# Patient Record
Sex: Female | Born: 1960
Health system: Southern US, Community
[De-identification: ages and names within clinical notes are randomized; demographics above are authoritative.]

## PROBLEM LIST (undated history)

## (undated) DIAGNOSIS — E079 Disorder of thyroid, unspecified: Secondary | ICD-10-CM

## (undated) DIAGNOSIS — G6181 Chronic inflammatory demyelinating polyneuritis: Secondary | ICD-10-CM

## (undated) HISTORY — PX: APPENDECTOMY: SHX54

## (undated) HISTORY — PX: CHOLECYSTECTOMY: SHX55

## (undated) HISTORY — PX: ABDOMINAL HYSTERECTOMY: SHX81

## (undated) HISTORY — PX: SPINAL FUSION: SHX223

---

## 2014-08-08 ENCOUNTER — Ambulatory Visit: Admit: 2014-08-08 | Payer: PRIVATE HEALTH INSURANCE | Attending: Neurology

## 2014-08-08 DIAGNOSIS — R29898 Other symptoms and signs involving the musculoskeletal system: Secondary | ICD-10-CM

## 2014-08-08 NOTE — Unmapped (Signed)
Neurology Attending  As the physician of record, I was physically present for the key portions of the history and physical exam. Pt seen and examined with resident. I participated in medical decision making. I have read the above note by Dr. Venetia Night and I agree with the information as documented.    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 Moved here from [place] and looking to establish care here with [place].    In 2006, she had an episode of syncope July.  When she woke up in the hospital she had one sided weakness.  Over 24 hours she had bilateral weakness from toes to neck.  Never ventilated.  She was diagnosed with GBS. Treated with IVIG for 3 days. Unsure of what the work up in [place] was at that time, maybe LP.  Four months in hospital and rehab. Walking by Christmas.  Spring of 2007, had another fainting episode. No hospital.  Then things got worse.  More pain with this second episode. Intense burning sensation in her bones all over. EMG this time.    Outpatient neurology.  Started IVIG again and then did this weekly for years.  Treatment stopped 2 years ago.  Unsure if she responded well.  Sounds like Humira was tried. It was stopped.    Functional:  She cannot walk. She can transfer. She told me she can stand at the sink.  Does her ADLs though with eating, bathing, dressing.  No limitations arms.  No bladder incontinence.  Pain issues in feet.    Exam:  Neuro:  MS: Alert and oriented.  Fluent.  Repeats, names.  Months forwards and backwards. Registers and then recalls 3/3 at 2 minutes.  CN: Visual fields full x4.  Pupils 5 to 3 bilaterally brisk symmetric.  EOM full.  No ptosis.  Face symmetric movement.  Facial sensation symmetric to pinprick.  Tongue midline, no tongue fasciculations.  Oropharynx symmetric, no dysarthria. Phonation good.  No nystagmus.  Fundus: deferred  M: Normal bulk, normal tone in upper extremities and lower extremities.   Strength R/L MRC is Deltoid 5/5 VFG, Triceps 5/5 VFG, Biceps 5-/5- VFG, Wrist extension 5/5, Wrist Flexion 5/5, FF 5/5, IO 4/4 VFG.  In lowers, Iliopsoas 4/4 to 4+/4+ VFG, Hip abduct 4/4 VFG, Hip adduct 4/4 VFG, Hamstrings 4+/4+ VFG with co-contraction Quadriceps, Quadriceps 4+/4+ VFG with co-contraction Hamstrings, Dorsiflex 3/3 giveway.  S: intact light touch in 4 extremities.  Intact vibration in toes at 3,4.  Position sense toes intact.    R: R/L  Toes down/down.  Ankles 2/2, knees 2/2, biceps 2/2, triceps 2/2.    C: FTN intact  Gait able to stand up, very unusual gait with right foot inverting and plantarflexing when she stands and tries to walk on it    General: Pleasant WF, NAD  Neck supple, no bruits  CTA  RRR  Extrem no c/c/e, purplish discoloration bilateral feet    No records to review    A/P 53 yo WF with inability to walk.  Confusing clinical picture. She sounds like she had GBS back in 2006, but even that is unusual as she says it started on one side of her body and then went to the other, rather than the typical progression from the legs to the arms.  Her exam was not consistent with CIDP.  She had good reflexes throughout, even at ankles. She says she had no reflexes until recently.  No signs of myelopathy by exam.  Her gait pattern looked almost more like a dystonic or UMN posturing of the right foot, but I saw no other UMN signs.    Diagnostic:  Repeat EMG.  Cancel if clear demyelinating disease.  Getting records from outside hospital for review. Need to know what her immunsoppression  Spine imaging?  Blood work?    No therapeutic intervention until we have a better understanding of this disease.  If this is CIDP, then we can think of pheresis, steroids, and other immunosuppression.    Will defer pain management to PCP or pain.  I am not able to write the narcotic prescriptions for her.    RTC after diagnostic testing.

## 2014-08-08 NOTE — Unmapped (Signed)
Pt seen and examined with resident.  Agreed with the findings on exam and the plan as detailed below.

## 2014-08-08 NOTE — Unmapped (Signed)
Subjective:      Patient ID: Molly Hays is a 53 y.o. female with a history of migraines, GBS, hypothyroid and lower back surgery presenting with lower extremity weakness.     HPI     Patient is present with her husband who corroborated. In 2006 diagnosed with GBS in [place], [place] [facility], [facility]). Patient was initially ill (viral), then had fainted in the parking lot and woke-up (in hospital) with stroke-like symptoms (weak on on one side, arm and leg). As the day progressed, the paralysis moved up to her neck, could not move bilateral upper or lower extremities. No bulbar involvement. Neurology at that hospital treated with 3 days IVIG. She was never intubated. She improved slowly after IVIG. Hospitalized in July and discharged to rehab in October. Pain was present, in hands and feet, pins and needles (per husband, patient herself does not remember). Fainting was attributed to low blood pressure, per patient.     By Christmas 2006 was walking un-aided.     May 2007 patient had another fainting episode. Not hospitalized. Awoke without deficits but trended downhill after that episode. She states that the weakness was primarily in her legs, don't want to go. Pain she describes as bone pain, states that it feels that her legs are, on fire like a blow torch.     Fall 2007 saw saw neurologist Dr. Oliver Barre who did an EMG and diagnosed her with CIDP. There was also a lumbar puncture done at this time, something about proteins.     Since Sept 2007 started on IVIG, at first was a couple days/week, then less frequently. Patient was going every 2 weeks, then monthly. Last IVIG was at least 2 years ago.     Was tried on Cellcept, Lyrica, Octogam?, Cymbalta, Humira. Others as well but cannot remember all the names.     Currently patient is not on anything for CIDP. Last treatment was with Humira, however discontinued 1 year ago due to supply/insurance.     Patient states that she  responded well to IVIG. She does not think Humira helped but she didn't take it regularly.     Currently the patient is in a wheelchair. She is looking for, a miracle. Currently patient is the weakest in her legs, she gets around the house in a wheelchair. She is able to transfer on her own. Eats on own. Bathes on own. Dresses on own. Pain is 10/10 at times, she says it was, everywhere, from her toes to her neck. Sitting exacerbates her pain. Lying on side brings relief. She does not think that any of her medication helps. She is on very high doses of opioids at this time.        Histories:     Medical history: upset stomach chronically (has seen GI for many years, gastroparesis?), hypothyroid (takes levothyroxine), chronic transaminitis, complex migraine with left sided paralysis.     Surgical history: cholecystectomy, appendectomy, lower spinal fusion, c-section x3. No surgery since 1996.     Family history: DM in brother. No autoimmune, no weak or wheelchair-bound family members.     Social history: Smoker, 1 pack/day. No EtOH. No other drugs.     Review of Systems     Constitutional: Negative for weight loss, weight gain and fatigue.   HENT: Negative for trouble swallowing and voice change.   Eyes: Negative for double vision, visual disturbance  Respiratory: Occasional SOB, pain related  Cardiovascular: Negative for chest pain  and palpitations.   Gastrointestinal: Positive for constipation, positive for upset stomach.   Genitourinary: Negative for frequency  Musculoskeletal: Positive for pain in arms, legs, back, feet   Neurological: Positive for headaches    Psychiatric/Behavioral: Negative for sleep disturbance.       Allergies:   Review of patient's allergies indicates no known allergies.    Medications:     Outpatient Encounter Prescriptions as of 08/08/2014   Medication Sig Dispense Refill   ??? almotriptan (AXERT) 12.5 MG tablet Take 12.5 mg by mouth as needed for Migraine. may repeat in 2 hours if needed        ??? cyclobenzaprine (FLEXERIL) 10 MG tablet Take 10 mg by mouth 2 times a day.       ??? frovatriptan (FROVA) 2.5 MG tablet Take 2.5 mg by mouth as needed for Migraine. If recurs, may repeat after 2 hours. Max of 3 tabs in 24 hours.       ??? levothyroxine (SYNTHROID, LEVOTHROID) 50 MCG tablet Take 50 mcg by mouth daily.       ??? oxyCODONE (OXYCONTIN) 30 MG Tb12 Take 30 mg by mouth 2 times a day.       ??? oxyCODONE (OXYCONTIN) 80 MG 12 hr tablet Take 80 mg by mouth 3 times a day as needed for Pain.       ??? oxyCODONE (ROXICODONE) 30 MG immediate release tablet Take 30 mg by mouth every 4 hours as needed for Pain.       ??? promethazine (PHENERGAN) 25 MG tablet Take 25 mg by mouth as needed for Nausea.       ??? sulfamethoxazole-trimethoprim (BACTRIM DS) 800-160 mg per tablet Take 1 tablet by mouth 2 times a day.       ??? zolpidem (AMBIEN) 10 mg tablet Take 10 mg by mouth at bedtime as needed for Sleep.         No facility-administered encounter medications on file as of 08/08/2014.        Objective:       Blood pressure 112/60, pulse 74, height 5' 8 (1.727 m), weight 188 lb (85.276 kg).    Neurologic Exam     Mental Status   Oriented to person, place, and time.   Attention: normal. Concentration: normal.   Speech: speech is normal     Cranial Nerves      CN II   Right visual field deficit: none  Left visual field deficit: none      CN III, IV, VI   Pupils are equal, round, and reactive to light.  Extraocular motions are normal.   Right pupil: Size: 3 mm.   Left pupil: Size: 3 mm.   CN III: no CN III palsy  CN VI: no CN VI palsy  Nystagmus: none   Diplopia: none     CN V   Facial sensation intact.      CN VII   Facial expression full, symmetric.      CN VIII   CN VIII normal.      CN IX, X   CN IX normal.      CN XI   CN XI normal.   Right sternocleidomastoid strength: normal  Left sternocleidomastoid strength: normal     CN XII   CN XII normal.   Tongue: not atrophic  Tongue deviation: none    Motor Exam   Muscle bulk:  decreased calf muscle bulk, bilateral   Overall muscle tone: normal  Strength   Left strength: Left lower extremity marginally better than right  Right deltoid: 5/5  Left deltoid: 5/5  Right biceps: 5/5  Left biceps: 5/5  Right triceps: 5/5  Left triceps: 5/5  Right wrist flexion: 5/5  Left wrist flexion: 5/5  Right wrist extension: 5/5  Left wrist extension: 5/5  Right interossei: 5/5  Left interossei: 5/5  Right quadriceps: 4/5  Left quadriceps: 4/5  Right hamstring: 4/5  Left hamstring: 4/5  Right anterior tibial: 3/5  Left anterior tibial: 3/5  Right posterior tibial: 3/5  Left posterior tibial: 3/5  Right gastroc: 3/5  Left gastroc: 3/5    Sensory Exam   Right arm light touch: normal  Left arm light touch: normal  Right leg light touch: decreased from knee  Left leg light touch: decreased from knee  Right arm proprioception: normal  Left arm proprioception: normal  Right leg proprioception: normal  Left leg proprioception: normal  Right arm pinprick: normal  Left arm pinprick: normal  Right leg pinprick: decreased from knee  Left leg pinprick: decreased from knee       Unable to do 2 point discriminationfrom knee down bilaterally     Gait, Coordination, and Reflexes      Tremor   Resting tremor: absent     Reflexes   Right brachioradialis: 2+  Left brachioradialis: 2+  Right biceps: 2+  Left biceps: 2+  Right triceps: 2+  Left triceps: 2+  Right patellar: 2+  Left patellar: 2+  Right achilles: 1+  Left achilles: 1+  Right plantar: normal  Left plantar: normal      Physical Exam   Constitutional: She is oriented to person, place, and time.   Eyes: EOM are normal. Pupils are equal, round, and reactive to light.   Neurological: She is oriented to person, place, and time.   Reflex Scores:       Tricep reflexes are 2+ on the right side and 2+ on the left side.       Bicep reflexes are 2+ on the right side and 2+ on the left side.       Brachioradialis reflexes are 2+ on the right side and 2+ on the left side.        Patellar reflexes are 2+ on the right side and 2+ on the left side.       Achilles reflexes are 1+ on the right side and 1+ on the left side.  Psychiatric: Her speech is normal.       Prior Diagnostic Testing:     EMG (2007) currently being sent over from OSH    LP results - currently being sent over from OSH         Assessment & Plan:     53 yo with a history of migraines, lower back fusion, hypothyroid who presents with chronic, relapsing condition of lower muscular weakness and sensory loss which has been stable for this past year year. Previously diagnosed with CIPD, AIDP and relapsing CIPD?Marland Kitchen Treated with various modalities including IVIG with no clear response. She has bilateral 2+ reflexes including ankle which would not be consistent with classic CIDP. Had prior lower back surgery (unclear on details) which could be contributing? Additionally, there are functional components to her motor exam. There is a paucity of medical records at this time.     - Repeat EMG  - Get records from OSH. They are en route per patient.   - Referral to pain clinic for  pain management  - Further treatment pending record review and new studies

## 2014-09-01 ENCOUNTER — Encounter: Attending: Neurology

## 2014-09-01 NOTE — Unmapped (Signed)
Patient called to check & see if medical records that she had requested (second time) have been received yet? Also need a prescription refilled. Please return call.

## 2014-09-02 MED ORDER — ibuprofen (ADVIL,MOTRIN) 800 MG tablet
800 | ORAL_TABLET | Freq: Three times a day (TID) | ORAL | Status: AC | PRN
Start: 2014-09-02 — End: ?

## 2014-09-02 NOTE — Unmapped (Signed)
Routing to Dr. Lysle Morales to address the [facility]

## 2014-09-02 NOTE — Unmapped (Signed)
Husband checking on records, were faxed to [phone], check bin and records were there-given to Dr Lysle Morales.  Also requests refill of 800 mg Ibuprofen for pain control, previously prescribed elsewhere.  To go to [facility].  Inquired re recommended pain clinic also.  Please advise husband.

## 2014-09-02 NOTE — Unmapped (Signed)
Notes reviewed from 2011 to April 2015.  No documented change in her strength exam from Oct 2011 to April 2015, despite being off and on treated with IVIG and Rituxan.  No EMG in any of those records from Dr. Smith Rolande Moe.    1. Please make sure she has an EMG scheduled.  Ordered on 9/11 but I do not see it scheduled in our system and I need that before her follow up visit with me.  Can use onf the 3:30 spots if we need to.

## 2014-09-02 NOTE — Unmapped (Signed)
Left a message on the pts vm that I have not received records for her. Also that if she needs rx to call me and let me know which one I will see if Dr. Lysle Morales will ok the refill.

## 2014-09-02 NOTE — Unmapped (Signed)
Okay with ibuprofen.  Written for 800 mg to be used intermittently and should be taken with food.  They would need to lok into a pain clinic. [facility] has one, but they might prefer someone closer to them in [place].

## 2014-09-03 NOTE — Unmapped (Signed)
Left a detailed message on the pts vm with the message below from Dr. Lysle Morales.   Rx for ibuprofen written and sent to the pharmacy, pain management through a pain clinic and records were received.

## 2014-09-10 ENCOUNTER — Ambulatory Visit: Admit: 2014-09-10 | Payer: PRIVATE HEALTH INSURANCE | Attending: Neurology

## 2014-09-10 DIAGNOSIS — Z8669 Personal history of other diseases of the nervous system and sense organs: Secondary | ICD-10-CM

## 2014-09-10 NOTE — Unmapped (Addendum)
Patient: Molly Hays Refer Physician: Dr. Caren Griffins  Date of Birth: 11/03/1961 Date of Exam: 09/10/2014  Age:               53 Years 3 Months           Test Physician:  Dr. Caren Griffins        Indication for Referral:  The patient was evaluated for right upper extremity mononeuropathies, radiculopathies, and peripheral neuropathy.      Summary:  The nerve conduction studies of the right leg and right arm were normal.  The needle exam of the right upper and right lower extremities was normal.  A new disposable needle electrode was used and discarded.      Interpretation:  The EMG of the right upper and right lower extremities was normal.  There was no evidence of large fiber sensorimotor peripheral neuropathy, inflammatory myopathy, nor cervical or lumbar radiculopathy on today???s exam.        ------------------------------------------------  Rickard Rhymes, M.D.          Sensory NCS      Nerve / Sites Take Off Peak  Amp.1-2 Dist. Vel.    ms ms ??V cm m/s   R MEDIAN - Index Finger      WRIST              2.34 3.23 42.7 13       ELBOW            6.98 8.91 24.5 27.2 58.7   R ULNAR - Digit 5th      Wrist 2.34 3.02 21.4 11       B.Elbow 8.23 9.58 20.6 36.3 61.7   R SURAL - Lat Malleolus      Distal Leg 6.93 8.96 41.6 14 20.2       Motor NCS      Nerve / Sites Latency Ampl. Distance Velocity    ms mV cm m/s   R MEDIAN - thenar      Wrist 3.96 13.6 7       Elbow 8.75 12.7 24.7 51.5   R ULNAR - Hypothenar      Wrist 2.97 10.4 6.5       A.Elbow 8.75 9.8 33.8 58.5   R COMM PERONEAL - Fibular-EDB      Ankle 4.74 5.6        Knee 14.01 4.6 44.2 47.7   R TIBIAL (KNEE) - AH      Ankle 3.85 12.2        Popliteal Fossa 13.13 11.5 41.2 44.4       F  Wave      Nerve Fmin    ms   R COMM PERONEAL - FIBULAR 55.21   R ULNAR 31.00   R TIBIAL (KNEE) 54.84   R MEDIAN 27.60       NERVE CONDUCTION STUDY NORMAL VALUES (skin temp. 34??? C)  MOTOR AMP LAT COND. VEL. F-WAVE    SENSORY AMP LAT COND. VEL.  (mV) (msec)   (m/sec) (msec)   (mV) (msec)    (m/sec)  Median (>3.9) (<4.5)   (>48)  (<32)       Radial (>20) (<2.9)   (>   )      Ulnar (>6.5) (<3.4)   (>53)  (<33)       Median (>24.9)(<3.6)   (>56)   Peroneal(>1.9)(<7.1)   (>40)  (< 59)       Ulnar (>9.6) (<3.0)   (>  54)  Tibial (> 3.9) (<5.9)   (>39)  (<57 )      Sural (>5.9) (<4.1)   (>    )        Needle Exam  EMG Summary Table     Spontaneous MUAP Recruitment    IA Fib PSW Fasc Other Amp Dur. PPP Pattern   R. TIB ANTERIOR N None None None None N N N N   R. GASTROCN (MED) N None None None None N N N N   R. VAST LATERALIS N None None None None N N N N   R. T FASCIA LATA N None None None None N N N N   R. BIC FEM (L HEAD) N None None None None N N N N   R. LUMB PSP (L) -- -- -- -- -- -- -- -- Deferred due to past lumbar surgery   R. DELTOID N None None None None N N N N   R. FIRST D INTEROSS N None None None None N N N N       EMG done with Dr. Odis Luster, Neuromuscular Fellow.  I was present for the entire procedure and participated in the key elements of the EMG.

## 2014-09-10 NOTE — Unmapped (Signed)
Patient: Molly Hays Refer Physician: Dr. Caren Griffins  Date of Birth: 12-27-1960 Date of Exam: 09/10/2014  Age:               53 Years 3 Months           Test Physician:  Dr. Caren Griffins        Indication for Referral:  The patient was evaluated for right upper extremity mononeuropathies, radiculopathies, and peripheral neuropathy.      Summary:  The nerve conduction studies of the right leg and right arm were normal.  The needle exam of the right upper and right lower extremities was normal.  A new disposable needle electrode was used and discarded.      Interpretation:  The EMG of the right upper and right lower extremities was normal.  There was no evidence of large fiber sensorimotor peripheral neuropathy, inflammatory myopathy, nor cervical or lumbar radiculopathy on today???s exam.        ------------------------------------------------  Rickard Rhymes, M.D.          Sensory NCS      Nerve / Sites Take Off Peak  Amp.1-2 Dist. Vel.    ms ms ??V cm m/s   R MEDIAN - Index Finger      WRIST              2.34 3.23 42.7 13       ELBOW            6.98 8.91 24.5 27.2 58.7   R ULNAR - Digit 5th      Wrist 2.34 3.02 21.4 11       B.Elbow 8.23 9.58 20.6 36.3 61.7   R SURAL - Lat Malleolus      Distal Leg 6.93 8.96 41.6 14 20.2       Motor NCS      Nerve / Sites Latency Ampl. Distance Velocity    ms mV cm m/s   R MEDIAN - thenar      Wrist 3.96 13.6 7       Elbow 8.75 12.7 24.7 51.5   R ULNAR - Hypothenar      Wrist 2.97 10.4 6.5       A.Elbow 8.75 9.8 33.8 58.5   R COMM PERONEAL - Fibular-EDB      Ankle 4.74 5.6        Knee 14.01 4.6 44.2 47.7   R TIBIAL (KNEE) - AH      Ankle 3.85 12.2        Popliteal Fossa 13.13 11.5 41.2 44.4       F  Wave      Nerve Fmin    ms   R COMM PERONEAL - FIBULAR 55.21   R ULNAR 31.00   R TIBIAL (KNEE) 54.84   R MEDIAN 27.60         NERVE CONDUCTION STUDY NORMAL VALUES (skin temp. 34??? C)  MOTOR AMP LAT COND. VEL. F-WAVE    SENSORY AMP LAT COND. VEL.  (mV) (msec)   (m/sec) (msec)   (mV) (msec)    (m/sec)  Median (>3.9) (<4.5)   (>48)  (<32)       Radial (>20) (<2.9)   (>   )      Ulnar (>6.5) (<3.4)   (>53)  (<33)       Median (>24.9)(<3.6)   (>56)   Peroneal(>1.9)(<7.1)   (>40)  (< 59)       Ulnar (>9.6) (<3.0)   (>  54)  Tibial (> 3.9) (<5.9)   (>39)  (<57 )      Sural (>5.9) (<4.1)   (>    )      Needle Exam  EMG Summary Table     Spontaneous MUAP Recruitment    IA Fib PSW Fasc Other Amp Dur. PPP Pattern   R. TIB ANTERIOR N None None None None N N N N   R. GASTROCN (MED) N None None None None N N N N   R. VAST LATERALIS N None None None None N N N N   R. T FASCIA LATA N None None None None N N N N   R. BIC FEM (L HEAD) N None None None None N N N N   R. LUMB PSP (L) -- -- -- -- -- -- -- -- Deferred due to past lumbar surgery   R. DELTOID N None None None None N N N N   R. FIRST D INTEROSS N None None None None N N N N       EMG done with Dr. Odis Luster, Neuromuscular Fellow.  I was present for the entire procedure and participated in the key elements of the EMG.

## 2014-10-10 ENCOUNTER — Ambulatory Visit: Admit: 2014-10-10 | Payer: PRIVATE HEALTH INSURANCE | Attending: Neurology

## 2014-10-10 DIAGNOSIS — Z8669 Personal history of other diseases of the nervous system and sense organs: Secondary | ICD-10-CM

## 2014-10-10 NOTE — Unmapped (Signed)
Subjective:      Patient ID: Molly Hays is a 53 y.o. female.    HPI   53 year old female with past history of complex migraines, depression and then a diagnosis of CIDP. Moved here from [place] and looking to establish care here with [place], and now here for follow up.  Had EMG for CIDP but the EMG was completely normal.  Notes reviewed from 2011 to April 2015.  No documented change in her strength exam from Oct 2011 to April 2015, despite being off and on treated with IVIG and Rituxan.  No EMG in any of those records from Dr. Smith Breckin [place].    Functional:  Continues to have pain and trouble walking.  She cannot walk. She can transfer. She told me she can stand at the sink.  Does her ADLs though with eating, bathing, dressing.  No limitations arms.  No bladder incontinence.  Pain issues in feet.           Histories:     She has a past medical history of H/O Guillain-Barre syndrome and Shingles (Nov 2015).    She has no past surgical history on file.    Her family history includes Kidney disease in her father.    She reports that she has been smoking.  She has never used smokeless tobacco. She reports that she does not drink alcohol or use illicit drugs.      Review of Systems   Constitutional: Positive for fatigue.   HENT: Negative for trouble swallowing and voice change.    Eyes: Negative for visual disturbance.   Respiratory: Negative for shortness of breath.    Cardiovascular: Negative for chest pain and palpitations.   Gastrointestinal: Negative for diarrhea and constipation.   Genitourinary: Negative for difficulty urinating.   Musculoskeletal: Positive for back pain and gait problem.       Allergies:   Review of patient's allergies indicates no known allergies.    Medications:     Outpatient Encounter Prescriptions as of 10/10/2014   Medication Sig Dispense Refill   ??? almotriptan (AXERT) 12.5 MG tablet Take 12.5 mg by mouth as needed for Migraine. may repeat in 2 hours if needed     ??? cyclobenzaprine (FLEXERIL)  10 MG tablet Take 10 mg by mouth 2 times a day.     ??? frovatriptan (FROVA) 2.5 MG tablet Take 2.5 mg by mouth as needed for Migraine. If recurs, may repeat after 2 hours. Max of 3 tabs in 24 hours.     ??? gabapentin (NEURONTIN) 300 MG capsule Take 300 mg by mouth 3 times a day.     ??? ibuprofen (ADVIL,MOTRIN) 800 MG tablet Take 1 tablet (800 mg total) by mouth every 8 hours as needed. 60 tablet 5   ??? levothyroxine (SYNTHROID, LEVOTHROID) 50 MCG tablet Take 50 mcg by mouth daily.     ??? oxyCODONE (OXYCONTIN) 30 MG Tb12 Take 30 mg by mouth 2 times a day.     ??? oxyCODONE (OXYCONTIN) 80 MG 12 hr tablet Take 80 mg by mouth 3 times a day as needed for Pain.     ??? oxyCODONE (ROXICODONE) 30 MG immediate release tablet Take 30 mg by mouth every 4 hours as needed for Pain.     ??? promethazine (PHENERGAN) 25 MG tablet Take 25 mg by mouth as needed for Nausea.     ??? zolpidem (AMBIEN) 10 mg tablet Take 10 mg by mouth at bedtime as needed for Sleep.     ???  sulfamethoxazole-trimethoprim (BACTRIM DS) 800-160 mg per tablet Take 1 tablet by mouth 2 times a day.       No facility-administered encounter medications on file as of 10/10/2014.        Objective:       Blood pressure 118/64, pulse 84, height 5' 8 (1.727 m), weight 192 lb (87.091 kg).    Neurologic Exam  MS: Alert and oriented.  Fluent.   CN: Visual fields deferred  Pupils brisk.  EOM full.  No ptosis.  Face symmetric movement.  Facial sensation deferred  Tongue midline.  No dysarthria. Phonation good.   Fundus: deferred  M: Strength R/L MRC is Deltoid 5/5 , Triceps 5/5 , Biceps 5/5, Wrist extension 5/5, Wrist Flexion 5/5, FF 5/5, IO 5/5.  In lowers, Iliopsoas 4/4+ VFG, Hip abduct 4/4 VFG, Hip adduct 4/4 VFG, Hamstrings 4+/4+ VFG with co-contraction Quadriceps, Quadriceps 4+/4+ VFG with co-contraction Hamstrings, Dorsiflex 3/3 giveway and foot inverts, FE 3/3 VFG  S: intact light touch in 4 extremities.   R: R/L  Toes down/down.  Ankles 2/2, knees 2/2, biceps 2/2, triceps 2/2.      C: FTN intact  Gait still with the right foot turning in but variable in its inversion, astasia-abasia; can take steps    Physical Exam  General: Pleasant WF, NAD  Neck supple, no bruits  CTA  RRR  Extrem no c/c/e, purplish discoloration bilateral feet    Prior Diagnostic Testing:   EMG               09/10/2014  Summary:  The nerve conduction studies of the right leg and right arm were normal.  The needle exam of the right upper and right lower extremities was normal.  A new disposable needle electrode was used and discarded.  Interpretation:          The EMG of the right upper and right lower extremities was normal.  There was no evidence of large fiber sensorimotor peripheral neuropathy, inflammatory myopathy, nor cervical or lumbar radiculopathy on today???s exam.         Assessment:     A/P 53 yo WF with inability to walk.  Confusing clinical picture. She sounds like she had GBS back in 2006, but even that is unusual as she says it started on one side of her body and then went to the other, rather than the typical progression from the legs to the arms.  Her exam was not consistent with CIDP.  She had good reflexes throughout, even at ankles. She says she had no reflexes until recently.  Neurophysiology also not consistent with CIDP.  Normal right side EMG arm and leg. No neuropathy.  No signs of myelopathy by exam.  Her gait pattern looked almost more like a dystonic or UMN posturing of the right foot, but I saw no other UMN signs on either exam.    Worried for deficits from the original GBS without good rehabilitation afterwards.  I do think she has the potential to get back to normal.    Plan:     No further diagnostic therapies at this time.    No therapeutic intervention needed. No signs of CIDP on the EMG.  No need for pheresis, steroids, and other immunosuppression.    Will defer pain management to PCP or pain.  I am not able to write the narcotic prescriptions for her.    Rehab medicine referral to get  her moving better. I do not think she needs  any medications, just a regular strategic plan for improvement.  I do not think she needs botox or anything for the foot inversion.  Maybe a brace with inversion block  for a period until she improves and can be weaned off of it.  PT for therapies for gait.    RTC 6 months

## 2015-04-10 ENCOUNTER — Encounter: Payer: PRIVATE HEALTH INSURANCE | Attending: Neurology

## 2017-04-24 ENCOUNTER — Encounter (HOSPITAL_COMMUNITY): Payer: Self-pay | Admitting: *Deleted

## 2017-04-24 ENCOUNTER — Emergency Department (HOSPITAL_COMMUNITY): Payer: Medicare Other

## 2017-04-24 ENCOUNTER — Emergency Department (HOSPITAL_COMMUNITY)
Admission: EM | Admit: 2017-04-24 | Discharge: 2017-04-24 | Disposition: A | Discharge status: Home / Self Care | Payer: Medicare Other | Attending: Emergency Medicine | Admitting: Emergency Medicine

## 2017-04-24 DIAGNOSIS — H5711 Ocular pain, right eye: Secondary | ICD-10-CM

## 2017-04-24 DIAGNOSIS — H469 Unspecified optic neuritis: Secondary | ICD-10-CM | POA: Diagnosis not present

## 2017-04-24 HISTORY — DX: Disorder of thyroid, unspecified: E07.9

## 2017-04-24 HISTORY — DX: Chronic inflammatory demyelinating polyneuritis: G61.81

## 2017-04-24 LAB — COMPREHENSIVE METABOLIC PANEL
ALT: 38 U/L (ref 14–54)
AST: 27 U/L (ref 15–41)
Albumin: 4.1 g/dL (ref 3.5–5.0)
Alkaline Phosphatase: 53 U/L (ref 38–126)
Anion gap: 7 (ref 5–15)
BUN: 13 mg/dL (ref 6–20)
CO2: 24 mmol/L (ref 22–32)
Calcium: 9.4 mg/dL (ref 8.9–10.3)
Chloride: 105 mmol/L (ref 101–111)
Creatinine, Ser: 0.74 mg/dL (ref 0.44–1.00)
GFR calc Af Amer: >60 mL/min (ref >60)
GFR calc non Af Amer: >60 mL/min (ref >60)
Glucose, Bld: 116 mg/dL — ABNORMAL HIGH (ref 65–99)
Potassium: 4.4 mmol/L (ref 3.5–5.1)
Sodium: 136 mmol/L (ref 135–145)
Total Bilirubin: 0.5 mg/dL (ref 0.3–1.2)
Total Protein: 6.5 g/dL (ref 6.5–8.1)

## 2017-04-24 LAB — CBC WITH DIFFERENTIAL/PLATELET
Basophils Absolute: 0 10*3/uL (ref 0.0–0.1)
Basophils Relative: 0 %
Eosinophils Absolute: 0.1 10*3/uL (ref 0.0–0.7)
Eosinophils Relative: 2 %
HCT: 43.6 % (ref 36.0–46.0)
Hemoglobin: 15.4 g/dL — ABNORMAL HIGH (ref 12.0–15.0)
Lymphocytes Relative: 30 %
Lymphs Abs: 2 10*3/uL (ref 0.7–4.0)
MCH: 32.2 pg (ref 26.0–34.0)
MCHC: 35.3 g/dL (ref 30.0–36.0)
MCV: 91.2 fL (ref 78.0–100.0)
Monocytes Absolute: 0.4 10*3/uL (ref 0.1–1.0)
Monocytes Relative: 6 %
Neutro Abs: 4 10*3/uL (ref 1.7–7.7)
Neutrophils Relative %: 62 %
Platelets: 200 10*3/uL (ref 150–400)
RBC: 4.78 MIL/uL (ref 3.87–5.11)
RDW: 12.8 % (ref 11.5–15.5)
WBC: 6.6 10*3/uL (ref 4.0–10.5)

## 2017-04-24 MED ORDER — CYCLOPENTOLATE HCL 1 % OP SOLN: 1.0000 [drp] | Freq: Three times a day (TID) | OPHTHALMIC | 0 refills | Status: DC

## 2017-04-24 MED ORDER — TETRACAINE HCL 0.5 % OP SOLN
2.0000 [drp] | Freq: Once | OPHTHALMIC | Status: AC
  Administered 2017-04-24: 2 [drp] via OPHTHALMIC
  Filled 2017-04-24: qty 2

## 2017-04-24 MED ORDER — MORPHINE SULFATE (PF) 4 MG/ML IV SOLN
4.0000 mg | Freq: Once | INTRAVENOUS | Status: AC
  Administered 2017-04-24: 4 mg via INTRAVENOUS
  Filled 2017-04-24: qty 1

## 2017-04-24 MED ORDER — ONDANSETRON 8 MG PO TBDP: 8.0000 mg | ORAL_TABLET | Freq: Three times a day (TID) | ORAL | 0 refills | Status: DC | PRN

## 2017-04-24 MED ORDER — FLUORESCEIN SODIUM 0.6 MG OP STRP
1.0000 | ORAL_STRIP | Freq: Once | OPHTHALMIC | Status: AC
Start: 2017-04-24 — End: 2017-04-24
  Administered 2017-04-24: 1 via OPHTHALMIC
  Filled 2017-04-24: qty 1

## 2017-04-24 MED ORDER — IBUPROFEN 800 MG PO TABS: 800.0000 mg | ORAL_TABLET | Freq: Three times a day (TID) | ORAL | 0 refills | Status: DC | PRN

## 2017-04-24 MED ORDER — FENTANYL CITRATE (PF) 100 MCG/2ML IJ SOLN
50.0000 ug | Freq: Once | INTRAMUSCULAR | Status: AC
  Administered 2017-04-24: 50 ug via INTRAVENOUS
  Filled 2017-04-24: qty 2

## 2017-04-24 MED ORDER — KETOROLAC TROMETHAMINE 30 MG/ML IJ SOLN
30.0000 mg | Freq: Once | INTRAMUSCULAR | Status: AC
  Administered 2017-04-24: 30 mg via INTRAVENOUS
  Filled 2017-04-24: qty 1

## 2017-04-24 MED ORDER — PREDNISOLONE ACETATE 1 % OP SUSP: 1.0000 [drp] | OPHTHALMIC | 0 refills | Status: DC

## 2017-04-24 MED ORDER — ONDANSETRON HCL 4 MG/2ML IJ SOLN
4.0000 mg | Freq: Once | INTRAMUSCULAR | Status: AC
  Administered 2017-04-24: 4 mg via INTRAVENOUS
  Filled 2017-04-24: qty 2

## 2017-04-24 MED ORDER — OXYCODONE-ACETAMINOPHEN 5-325 MG PO TABS
2.0000 | ORAL_TABLET | Freq: Once | ORAL | Status: AC
  Administered 2017-04-24: 2 via ORAL
  Filled 2017-04-24: qty 2

## 2017-04-24 MED ORDER — GADOBENATE DIMEGLUMINE 529 MG/ML IV SOLN
20.0000 mL | Freq: Once | INTRAVENOUS | Status: AC
  Administered 2017-04-24: 20 mL via INTRAVENOUS

## 2017-04-24 MED ORDER — CLINDAMYCIN HCL 300 MG PO CAPS: 300.0000 mg | ORAL_CAPSULE | Freq: Three times a day (TID) | ORAL | 0 refills | Status: DC

## 2017-04-24 MED ORDER — IOPAMIDOL (ISOVUE-300) INJECTION 61%
INTRAVENOUS | Status: AC
  Administered 2017-04-24: 75 mL via INTRAVENOUS
  Filled 2017-04-24: qty 75

## 2017-04-24 MED ORDER — OXYCODONE-ACETAMINOPHEN 5-325 MG PO TABS: 1.0000 | ORAL_TABLET | ORAL | 0 refills | Status: DC | PRN

## 2017-04-24 MED ORDER — LORAZEPAM 2 MG/ML IJ SOLN
1.0000 mg | Freq: Once | INTRAMUSCULAR | Status: AC
  Administered 2017-04-24: 1 mg via INTRAVENOUS
  Filled 2017-04-24: qty 1

## 2017-04-24 NOTE — ED Notes (Signed)
RN to administer lorazepam when transport arrives to take patient for MRI. Patient aware.

## 2017-04-24 NOTE — ED Notes (Signed)
Patient verbalized understanding of discharge instructions and denies any further needs or questions at this time. VS stable. RN escorted to ED entrance in wheelchair.

## 2017-04-24 NOTE — ED Notes (Signed)
MD at bedside. 

## 2017-04-24 NOTE — ED Triage Notes (Signed)
Pt in c/o R eye pain & R facial pain onset x 3 days ago, pt seen at [facility] & sent here for eval, per report pts visual acuity completed with no abnormalities noted, c/o severe itching & throbbing & burning the R eye, reports some blurred vision, denies injury to the area, A&O x4

## 2017-04-24 NOTE — ED Provider Notes (Signed)
MR Negative for acute findings. Patient will be started on abx drops per optho recommendations. Dr Kristeen Miss to see in the office in the morning. Pain control now. Home with pain medicine.   Mr Denise Woods HE Contrast  Result Date: 04/24/2017 CLINICAL DATA:  RIGHT eye pain. Blurred vision. Evaluate for optic neuritis. EXAM: MRI OF THE ORBITS WITHOUT AND WITH CONTRAST TECHNIQUE: Multiplanar, multisequence MR imaging of the orbits was performed both before and after the administration of intravenous contrast. CONTRAST:  24mL MULTIHANCE GADOBENATE DIMEGLUMINE 529 MG/ML IV SOLN COMPARISON:  CT orbits done earlier today. FINDINGS: Orbits: There is no traumatic or inflammatory process affecting the globes, optic nerves, extraocular muscles, lacrimal glands, or orbital fat. Both optic nerves are symmetric in size and signal. No evidence for optic neuritis. No abnormal postcontrast enhancement. There appears to be slight inflammation of the RIGHT preseptal periorbital soft tissues; this is more prominent inferiorly. Correlate clinically for periorbital cellulitis. Intracranial: No mass or visible white matter disease. Normal cavernous sinuses. Normal visualized intracranial optic pathways, including anterior optic nerves and chiasm. Normal pituitary and stalk. Post infusion through the entire head demonstrates no abnormal enhancement of the brain or meninges. Paranasal sinuses: Mild mucosal thickening in the ethmoid sinuses. No layering fluid or significant opacity. Other: Small LEFT mastoid effusion, non worrisome. Normal appearing upper cervical spine. No facial abnormalities. IMPRESSION: MRI of the orbits without with contrast is negative for optic neuritis or significant orbital inflammatory process. There may be mild RIGHT preseptal periorbital cellulitis. Correlate clinically. No postseptal inflammation. Electronically Signed   By: Staci Righter M.D.   On: 04/24/2017 19:27       Jola Schmidt, MD 04/24/17 Joen Laura

## 2017-04-24 NOTE — ED Provider Notes (Signed)
[facility] DEPT Provider Note   CSN: [id number] Arrival date & time: 04/24/17  1108     History   Chief Complaint No chief complaint on file.   HPI Denise Woods is a 56 y.o. female.  HPI Denise Woods is a 56 y.o. female presents to emergency department complaining of severe right eye pain. Patient reports gradual onset of eye pain and burning that started 3 days ago. She states it has gradually gotten worse. Reports associated redness and swelling. Reports clear drainage. Denies any visual changes. States she is sensitive to lights. No history of eye problems. Wears reading glasses. Does not wear any contacts. No injuries. Patient went to urgent care today was sent here for further evaluation. She did not try any treatment eyedrops prior to coming in.   Past Medical History:  Diagnosis Date  . CIDP (chronic inflammatory demyelinating polyneuropathy) ([place])   . Thyroid disease     There are no active problems to display for this patient.   Past Surgical History:  Procedure Laterality Date  . ABDOMINAL HYSTERECTOMY    . APPENDECTOMY    . CHOLECYSTECTOMY    . SPINAL FUSION      OB History    No data available       Home Medications    Prior to Admission medications   Not on File    Family History No family history on file.  Social History Social History  Substance Use Topics  . Smoking status: Never Smoker  . Smokeless tobacco: Never Used  . Alcohol use No     Allergies   Patient has no known allergies.   Review of Systems Review of Systems  Constitutional: Negative for chills and fever.  Eyes: Positive for photophobia, pain, discharge and redness. Negative for itching and visual disturbance.  Respiratory: Negative for cough, chest tightness and shortness of breath.   Cardiovascular: Negative for chest pain, palpitations and leg swelling.  Gastrointestinal: Negative for abdominal pain, diarrhea, nausea and vomiting.  Genitourinary: Negative for  dysuria and flank pain.  Musculoskeletal: Negative for arthralgias, myalgias, neck pain and neck stiffness.  Skin: Negative for rash.  Neurological: Negative for dizziness, weakness and headaches.  All other systems reviewed and are negative.    Physical Exam Updated Vital Signs BP (!) 110/49 (BP Location: Left Arm)   Pulse 62   Temp 97.6 F (36.4 C)   Resp 16   Ht 5\' 8"  (1.727 m)   Wt 88.5 kg (195 lb)   SpO2 99%   BMI 29.65 kg/m   Physical Exam  Constitutional: She appears well-developed and well-nourished. No distress.  HENT:  Head: Normocephalic.  Eyes: EOM are normal. Pupils are equal, round, and reactive to light.  Mild conjunctival injection present bilaterally, patient is crying. There is a pinpoint pustule to the right lower lateral internal lid. Mild tenderness over that area. Otherwise no lid swelling or induration, no significant periorbital swelling. No corneal abrasions with fluorescein stain. Slit-lamp exam unremarkable. Eye pressure averaged 17 mmHg.   Neck: Neck supple.  Cardiovascular: Normal rate, regular rhythm and normal heart sounds.   Pulmonary/Chest: Effort normal and breath sounds normal. No respiratory distress. She has no wheezes. She has no rales.  Abdominal: Soft. Bowel sounds are normal. She exhibits no distension. There is no tenderness. There is no rebound.  Musculoskeletal: She exhibits no edema.  Neurological: She is alert.  Skin: Skin is warm and dry.  Psychiatric: She has a normal mood and affect. Her behavior  is normal.  Nursing note and vitals reviewed.    ED Treatments / Results  Labs (all labs ordered are listed, but only abnormal results are displayed) Labs Reviewed  CBC WITH DIFFERENTIAL/PLATELET - Abnormal; Notable for the following:       Result Value   Hemoglobin 15.4 (*)    All other components within normal limits  COMPREHENSIVE METABOLIC PANEL - Abnormal; Notable for the following:    Glucose, Bld 116 (*)    All other  components within normal limits    EKG  EKG Interpretation None       Radiology No results found.  Procedures Procedures (including critical care time)  Medications Ordered in ED Medications  tetracaine (PONTOCAINE) 0.5 % ophthalmic solution 2 drop (not administered)  fluorescein ophthalmic strip 1 strip (not administered)     Initial Impression / Assessment and Plan / ED Course  I have reviewed the triage vital signs and the nursing notes.  Pertinent labs & imaging results that were available during my care of the patient were reviewed by me and considered in my medical decision making (see chart for details).     Patient did emergency department with severe eye pain progressing over the last 3 days. Although she has a small stye to the lower lid, I do not suspect this is as a cause of her pain. She is describing sharp burning pain to the eye, behind the eye and entire periorbital area. I exam unremarkable otherwise. Her visual acuity is 20/200 and the right, 20/70 in the left with glasses. Pressure is 17 mmHg in the right eye. No fluorescein uptake or corneal abrasions noted with the [facility] evaluation. Will get CT orbits to rule out orbital cellulitis. Discussed with Dr. Lanny Cramp who was inpatient as well and agrees.   3:29 PM CT is negative. Will consult ophthalmology.  3:52 PM Discussed pt with Dr. Kristeen Miss with ophthalmology. She recommended MR with contrast to ro optic neuritis. If positive she would liked to be contacted and if negative, give  Prednisolone acetate 1% in right eye Q2hrs while awake, Cyclopentolate 1% both eyes TID. Follow up with her after 8am tomorrow.   Final Clinical Impressions(s) / ED Diagnoses   Final diagnoses:  None    New Prescriptions New Prescriptions   No medications on file     Jeannett Senior, Hershal Coria 04/26/17 2347    Margette Fast, MD 04/28/17 2101

## 2017-04-24 NOTE — ED Notes (Signed)
Patient transported to MRI via stretcher.

## 2017-04-24 NOTE — ED Notes (Signed)
Patient transported to CT 

## 2017-11-30 DIAGNOSIS — J209 Acute bronchitis, unspecified: Secondary | ICD-10-CM | POA: Diagnosis not present

## 2017-11-30 DIAGNOSIS — J0100 Acute maxillary sinusitis, unspecified: Secondary | ICD-10-CM | POA: Diagnosis not present

## 2017-12-30 ENCOUNTER — Emergency Department (INDEPENDENT_AMBULATORY_CARE_PROVIDER_SITE_OTHER)
Admission: EM | Admit: 2017-12-30 | Discharge: 2017-12-30 | Disposition: A | Discharge status: Home / Self Care | Payer: PPO | Source: Home / Self Care | Attending: Family Medicine | Admitting: Family Medicine

## 2017-12-30 ENCOUNTER — Encounter: Payer: Self-pay | Admitting: Emergency Medicine

## 2017-12-30 ENCOUNTER — Emergency Department (INDEPENDENT_AMBULATORY_CARE_PROVIDER_SITE_OTHER): Payer: PPO

## 2017-12-30 DIAGNOSIS — J209 Acute bronchitis, unspecified: Secondary | ICD-10-CM | POA: Diagnosis not present

## 2017-12-30 DIAGNOSIS — R05 Cough: Secondary | ICD-10-CM | POA: Diagnosis not present

## 2017-12-30 MED ORDER — METHYLPREDNISOLONE ACETATE 80 MG/ML IJ SUSP
80.0000 mg | Freq: Once | INTRAMUSCULAR | Status: AC
  Administered 2017-12-30: 80 mg via INTRAMUSCULAR

## 2017-12-30 MED ORDER — BENZONATATE 200 MG PO CAPS: ORAL_CAPSULE | ORAL | 0 refills | Status: AC

## 2017-12-30 MED ORDER — LEVOFLOXACIN 500 MG PO TABS: ORAL_TABLET | ORAL | 0 refills | Status: AC

## 2017-12-30 NOTE — ED Provider Notes (Signed)
[facility]    CSN: [id number] Arrival date & time: 12/30/17  1742     History   Chief Complaint Chief Complaint  Patient presents with  . Facial Pain    HPI Denise Woods is a 57 y.o. female.   Patient reports that she developed a URI approximately one month ago that was treated with both a Z-pak and Augmentin.  After 10 days she seemed well. About 8 days ago she developed recurrent headache, sinus congestion, fatigue, and myalgias.  Three days ago she developed a cough, and two days ago chills/sweats.  No pleuritic pain or shortness of breath.     The history is provided by the patient.    Past Medical History:  Diagnosis Date  . CIDP (chronic inflammatory demyelinating polyneuropathy) ([place])   . Thyroid disease     There are no active problems to display for this patient.   Past Surgical History:  Procedure Laterality Date  . ABDOMINAL HYSTERECTOMY    . APPENDECTOMY    . CHOLECYSTECTOMY    . SPINAL FUSION      OB History    No data available       Home Medications    Prior to Admission medications   Medication Sig Start Date End Date Taking? Authorizing Provider  benzonatate (TESSALON) 200 MG capsule Take one cap by mouth at bedtime as needed for cough.  May repeat in 4 to 6 hours 12/30/17   Kandra Nicolas, MD  levofloxacin (LEVAQUIN) 500 MG tablet Take one tab by mouth once daily for 7 days. 12/30/17   Kandra Nicolas, MD  levothyroxine (SYNTHROID, LEVOTHROID) 50 MCG tablet Take 50 mcg by mouth daily before breakfast.    [provider]    Family History No family history on file.  Social History Social History   Tobacco Use  . Smoking status: Never Smoker  . Smokeless tobacco: Never Used  Substance Use Topics  . Alcohol use: No  . Drug use: No     Allergies   Patient has no known allergies.   Review of Systems Review of Systems + sore throat + cough No pleuritic pain No wheezing + nasal congestion + post-nasal  drainage + sinus pain/pressure No itchy/red eyes No earache No hemoptysis No SOB + fever, + chills No nausea No vomiting No abdominal pain No diarrhea No urinary symptoms No skin rash + fatigue + myalgias + headache Used OTC meds without relief   Physical Exam Triage Vital Signs ED Triage Vitals [12/30/17 1809]  Enc Vitals Group     BP (!) 106/59     Pulse Rate 61     Resp      Temp 98 F (36.7 C)     Temp Source Oral     SpO2 98 %     Weight 185 lb (83.9 kg)     Height 5\' 8"  (1.727 m)     Head Circumference      Peak Flow      Pain Score 0     Pain Loc      Pain Edu?      Excl. in [place]?    No data found.  Updated Vital Signs BP (!) 106/59 (BP Location: Right Arm)   Pulse 61   Temp 98 F (36.7 C) (Oral)   Ht 5\' 8"  (1.727 m)   Wt 185 lb (83.9 kg)   SpO2 98%   BMI 28.13 kg/m   Visual Acuity Right Eye  Distance:   Left Eye Distance:   Bilateral Distance:    Right Eye Near:   Left Eye Near:    Bilateral Near:     Physical Exam Nursing notes and Vital Signs reviewed. Appearance:  Patient appears stated age, and in no acute distress Eyes:  Pupils are equal, round, and reactive to light and accomodation.  Extraocular movement is intact.  Conjunctivae are not inflamed  Ears:  Canals normal.  Tympanic membranes normal.  Nose:  Mildly congested turbinates.  No sinus tenderness.    Pharynx:  Normal Neck:  Supple.  Enlarged posterior/lateral nodes are palpated bilaterally, tender to palpation on the left.   Lungs:   Bilateral rhonchi posteriorly, and few faint expiratory wheezes anteriorly.  Breath sounds are equal.  Moving air well. Heart:  Regular rate and rhythm without murmurs, rubs, or gallops.  Abdomen:  Nontender without masses or hepatosplenomegaly.  Bowel sounds are present.  No CVA or flank tenderness.  Extremities:  No edema.  Skin:  No rash present.    UC Treatments / Results  Labs (all labs ordered are listed, but only abnormal results are  displayed) Labs Reviewed - No data to display  EKG  EKG Interpretation None       Radiology Dg Chest 2 View  Result Date: 12/30/2017 CLINICAL DATA:  Eight day history of cough associated with chest tightness. EXAM: CHEST  2 VIEW COMPARISON:  06/19/2017. FINDINGS: Cardiomediastinal silhouette unremarkable, unchanged. Mildly prominent bronchovascular markings diffusely and mild central peribronchial thickening, more so than on the prior examination. Lungs otherwise clear. No localized airspace consolidation. No pleural effusions. No pneumothorax. Normal pulmonary vascularity. Thoracolumbar dextroscoliosis is noted previously. IMPRESSION: Mild changes of acute bronchitis and/or asthma without focal airspace pneumonia. Electronically Signed   By: Evangeline Dakin M.D.   On: 12/30/2017 20:20    Procedures Procedures (including critical care time)  Medications Ordered in UC Medications  methylPREDNISolone acetate (DEPO-MEDROL) injection 80 mg (not administered)     Initial Impression / Assessment and Plan / UC Course  I have reviewed the triage vital signs and the nursing notes.  Pertinent labs & imaging results that were available during my care of the patient were reviewed by me and considered in my medical decision making (see chart for details).    Begin Levaquin 500mg  daily for one week. Administered Depo Medrol 80mg  IM. Prescription written for Benzonatate [facility]) to take at bedtime for night-time cough.  Take plain guaifenesin (1200mg  extended release tabs such as Mucinex) twice daily, with plenty of water, for cough and congestion.  May add Pseudoephedrine (30mg , one or two every 4 to 6 hours) for sinus congestion.  Get adequate rest.   May use Afrin nasal spray (or generic oxymetazoline) each morning for about 5 days and then discontinue.  Also recommend using saline nasal spray several times daily and saline nasal irrigation (AYR is a common brand).   Try warm salt water  gargles for sore throat.  Stop all antihistamines for now, and other non-prescription cough/cold preparations. May take Delsym Cough Suppressant at bedtime for nighttime cough.  Followup with Family Doctor if not improved in one week.     Final Clinical Impressions(s) / UC Diagnoses   Final diagnoses:  Acute bronchitis, unspecified organism    ED Discharge Orders        Ordered    levofloxacin (LEVAQUIN) 500 MG tablet     12/30/17 2035    benzonatate (TESSALON) 200 MG capsule  12/30/17 2035          Kandra Nicolas, MD 01/02/18 1152

## 2017-12-30 NOTE — ED Triage Notes (Signed)
Patient complaining of sinus pressure and pain, HA x 1 week, sore throat, ear pain, body aches and chills.

## 2017-12-30 NOTE — Discharge Instructions (Signed)
Take plain guaifenesin (1200mg  extended release tabs such as Mucinex) twice daily, with plenty of water, for cough and congestion.  May add Pseudoephedrine (30mg , one or two every 4 to 6 hours) for sinus congestion.  Get adequate rest.   May use Afrin nasal spray (or generic oxymetazoline) each morning for about 5 days and then discontinue.  Also recommend using saline nasal spray several times daily and saline nasal irrigation (AYR is a common brand).   Try warm salt water gargles for sore throat.  Stop all antihistamines for now, and other non-prescription cough/cold preparations. May take Delsym Cough Suppressant at bedtime for nighttime cough.

## 2018-01-23 DIAGNOSIS — Z8639 Personal history of other endocrine, nutritional and metabolic disease: Secondary | ICD-10-CM | POA: Diagnosis not present

## 2018-01-23 DIAGNOSIS — N951 Menopausal and female climacteric states: Secondary | ICD-10-CM | POA: Diagnosis not present

## 2018-01-23 DIAGNOSIS — E236 Other disorders of pituitary gland: Secondary | ICD-10-CM | POA: Diagnosis not present

## 2018-01-23 DIAGNOSIS — E039 Hypothyroidism, unspecified: Secondary | ICD-10-CM | POA: Diagnosis not present

## 2018-01-23 DIAGNOSIS — Z79890 Hormone replacement therapy (postmenopausal): Secondary | ICD-10-CM | POA: Diagnosis not present

## 2018-04-02 DIAGNOSIS — R0602 Shortness of breath: Secondary | ICD-10-CM | POA: Diagnosis not present

## 2018-04-02 DIAGNOSIS — Z720 Tobacco use: Secondary | ICD-10-CM | POA: Diagnosis not present

## 2018-04-02 DIAGNOSIS — R55 Syncope and collapse: Secondary | ICD-10-CM | POA: Diagnosis not present

## 2018-04-02 DIAGNOSIS — R079 Chest pain, unspecified: Secondary | ICD-10-CM | POA: Diagnosis not present

## 2018-04-02 DIAGNOSIS — I4510 Unspecified right bundle-branch block: Secondary | ICD-10-CM | POA: Diagnosis not present

## 2018-04-05 DIAGNOSIS — R0602 Shortness of breath: Secondary | ICD-10-CM | POA: Diagnosis not present

## 2018-04-05 DIAGNOSIS — I499 Cardiac arrhythmia, unspecified: Secondary | ICD-10-CM | POA: Diagnosis not present

## 2018-04-05 DIAGNOSIS — R079 Chest pain, unspecified: Secondary | ICD-10-CM | POA: Diagnosis not present

## 2018-04-05 DIAGNOSIS — I4510 Unspecified right bundle-branch block: Secondary | ICD-10-CM | POA: Diagnosis not present

## 2018-04-05 DIAGNOSIS — R55 Syncope and collapse: Secondary | ICD-10-CM | POA: Diagnosis not present

## 2018-04-06 DIAGNOSIS — R55 Syncope and collapse: Secondary | ICD-10-CM | POA: Diagnosis not present

## 2018-04-20 DIAGNOSIS — R079 Chest pain, unspecified: Secondary | ICD-10-CM | POA: Diagnosis not present

## 2018-04-20 DIAGNOSIS — R0602 Shortness of breath: Secondary | ICD-10-CM | POA: Diagnosis not present

## 2018-04-20 DIAGNOSIS — I4510 Unspecified right bundle-branch block: Secondary | ICD-10-CM | POA: Diagnosis not present

## 2018-04-20 DIAGNOSIS — R55 Syncope and collapse: Secondary | ICD-10-CM | POA: Diagnosis not present

## 2018-04-26 DIAGNOSIS — R55 Syncope and collapse: Secondary | ICD-10-CM | POA: Diagnosis not present

## 2018-04-26 DIAGNOSIS — L700 Acne vulgaris: Secondary | ICD-10-CM | POA: Diagnosis not present

## 2018-04-26 DIAGNOSIS — I4510 Unspecified right bundle-branch block: Secondary | ICD-10-CM | POA: Diagnosis not present

## 2018-04-26 DIAGNOSIS — R0602 Shortness of breath: Secondary | ICD-10-CM | POA: Diagnosis not present

## 2018-04-26 DIAGNOSIS — L718 Other rosacea: Secondary | ICD-10-CM | POA: Diagnosis not present

## 2018-04-26 DIAGNOSIS — R079 Chest pain, unspecified: Secondary | ICD-10-CM | POA: Diagnosis not present

## 2018-05-18 DIAGNOSIS — M545 Low back pain: Secondary | ICD-10-CM | POA: Diagnosis not present

## 2018-05-18 DIAGNOSIS — M47816 Spondylosis without myelopathy or radiculopathy, lumbar region: Secondary | ICD-10-CM | POA: Diagnosis not present

## 2018-05-18 DIAGNOSIS — M5187 Other intervertebral disc disorders, lumbosacral region: Secondary | ICD-10-CM | POA: Diagnosis not present

## 2018-05-18 DIAGNOSIS — M5442 Lumbago with sciatica, left side: Secondary | ICD-10-CM | POA: Diagnosis not present

## 2018-05-18 DIAGNOSIS — G8929 Other chronic pain: Secondary | ICD-10-CM | POA: Diagnosis not present

## 2018-06-06 DIAGNOSIS — M5442 Lumbago with sciatica, left side: Secondary | ICD-10-CM | POA: Diagnosis not present

## 2018-06-06 DIAGNOSIS — G8929 Other chronic pain: Secondary | ICD-10-CM | POA: Diagnosis not present

## 2018-06-06 DIAGNOSIS — M4326 Fusion of spine, lumbar region: Secondary | ICD-10-CM | POA: Diagnosis not present

## 2018-06-06 DIAGNOSIS — M4316 Spondylolisthesis, lumbar region: Secondary | ICD-10-CM | POA: Diagnosis not present

## 2018-06-06 DIAGNOSIS — M4726 Other spondylosis with radiculopathy, lumbar region: Secondary | ICD-10-CM | POA: Diagnosis not present

## 2018-06-12 DIAGNOSIS — G894 Chronic pain syndrome: Secondary | ICD-10-CM | POA: Diagnosis not present

## 2018-06-12 DIAGNOSIS — G6181 Chronic inflammatory demyelinating polyneuritis: Secondary | ICD-10-CM | POA: Diagnosis not present

## 2018-06-12 DIAGNOSIS — M792 Neuralgia and neuritis, unspecified: Secondary | ICD-10-CM | POA: Diagnosis not present

## 2018-06-12 DIAGNOSIS — M5442 Lumbago with sciatica, left side: Secondary | ICD-10-CM | POA: Diagnosis not present

## 2018-06-12 DIAGNOSIS — M4726 Other spondylosis with radiculopathy, lumbar region: Secondary | ICD-10-CM | POA: Diagnosis not present

## 2018-06-12 DIAGNOSIS — G8929 Other chronic pain: Secondary | ICD-10-CM | POA: Diagnosis not present

## 2018-07-02 DIAGNOSIS — M5416 Radiculopathy, lumbar region: Secondary | ICD-10-CM | POA: Diagnosis not present

## 2018-07-13 DIAGNOSIS — M5442 Lumbago with sciatica, left side: Secondary | ICD-10-CM | POA: Diagnosis not present

## 2018-07-13 DIAGNOSIS — M4726 Other spondylosis with radiculopathy, lumbar region: Secondary | ICD-10-CM | POA: Diagnosis not present

## 2018-07-13 DIAGNOSIS — R29898 Other symptoms and signs involving the musculoskeletal system: Secondary | ICD-10-CM | POA: Diagnosis not present

## 2018-07-13 DIAGNOSIS — M6281 Muscle weakness (generalized): Secondary | ICD-10-CM | POA: Diagnosis not present

## 2018-07-13 DIAGNOSIS — G8929 Other chronic pain: Secondary | ICD-10-CM | POA: Diagnosis not present

## 2018-07-13 DIAGNOSIS — Z7409 Other reduced mobility: Secondary | ICD-10-CM | POA: Diagnosis not present

## 2018-07-16 DIAGNOSIS — M4726 Other spondylosis with radiculopathy, lumbar region: Secondary | ICD-10-CM | POA: Diagnosis not present

## 2018-07-16 DIAGNOSIS — Z7409 Other reduced mobility: Secondary | ICD-10-CM | POA: Diagnosis not present

## 2018-07-16 DIAGNOSIS — R29898 Other symptoms and signs involving the musculoskeletal system: Secondary | ICD-10-CM | POA: Diagnosis not present

## 2018-07-16 DIAGNOSIS — M6281 Muscle weakness (generalized): Secondary | ICD-10-CM | POA: Diagnosis not present

## 2018-07-16 DIAGNOSIS — G8929 Other chronic pain: Secondary | ICD-10-CM | POA: Diagnosis not present

## 2018-07-16 DIAGNOSIS — M5442 Lumbago with sciatica, left side: Secondary | ICD-10-CM | POA: Diagnosis not present

## 2018-07-18 DIAGNOSIS — Z7409 Other reduced mobility: Secondary | ICD-10-CM | POA: Diagnosis not present

## 2018-07-18 DIAGNOSIS — G8929 Other chronic pain: Secondary | ICD-10-CM | POA: Diagnosis not present

## 2018-07-18 DIAGNOSIS — M5442 Lumbago with sciatica, left side: Secondary | ICD-10-CM | POA: Diagnosis not present

## 2018-07-18 DIAGNOSIS — M6281 Muscle weakness (generalized): Secondary | ICD-10-CM | POA: Diagnosis not present

## 2018-07-18 DIAGNOSIS — R29898 Other symptoms and signs involving the musculoskeletal system: Secondary | ICD-10-CM | POA: Diagnosis not present

## 2018-07-18 DIAGNOSIS — M4726 Other spondylosis with radiculopathy, lumbar region: Secondary | ICD-10-CM | POA: Diagnosis not present

## 2018-07-23 DIAGNOSIS — M4726 Other spondylosis with radiculopathy, lumbar region: Secondary | ICD-10-CM | POA: Diagnosis not present

## 2018-07-23 DIAGNOSIS — M6281 Muscle weakness (generalized): Secondary | ICD-10-CM | POA: Diagnosis not present

## 2018-07-23 DIAGNOSIS — M5442 Lumbago with sciatica, left side: Secondary | ICD-10-CM | POA: Diagnosis not present

## 2018-07-23 DIAGNOSIS — R29898 Other symptoms and signs involving the musculoskeletal system: Secondary | ICD-10-CM | POA: Diagnosis not present

## 2018-07-23 DIAGNOSIS — Z7409 Other reduced mobility: Secondary | ICD-10-CM | POA: Diagnosis not present

## 2018-07-23 DIAGNOSIS — G8929 Other chronic pain: Secondary | ICD-10-CM | POA: Diagnosis not present

## 2018-07-31 DIAGNOSIS — M961 Postlaminectomy syndrome, not elsewhere classified: Secondary | ICD-10-CM | POA: Diagnosis not present

## 2018-07-31 DIAGNOSIS — M792 Neuralgia and neuritis, unspecified: Secondary | ICD-10-CM | POA: Diagnosis not present

## 2018-07-31 DIAGNOSIS — G971 Other reaction to spinal and lumbar puncture: Secondary | ICD-10-CM | POA: Diagnosis not present

## 2018-07-31 DIAGNOSIS — G8929 Other chronic pain: Secondary | ICD-10-CM | POA: Diagnosis not present

## 2018-07-31 DIAGNOSIS — M5442 Lumbago with sciatica, left side: Secondary | ICD-10-CM | POA: Diagnosis not present

## 2018-08-02 DIAGNOSIS — Z7409 Other reduced mobility: Secondary | ICD-10-CM | POA: Diagnosis not present

## 2018-08-02 DIAGNOSIS — R29898 Other symptoms and signs involving the musculoskeletal system: Secondary | ICD-10-CM | POA: Diagnosis not present

## 2018-08-02 DIAGNOSIS — M5442 Lumbago with sciatica, left side: Secondary | ICD-10-CM | POA: Diagnosis not present

## 2018-08-02 DIAGNOSIS — M4726 Other spondylosis with radiculopathy, lumbar region: Secondary | ICD-10-CM | POA: Diagnosis not present

## 2018-08-02 DIAGNOSIS — M6281 Muscle weakness (generalized): Secondary | ICD-10-CM | POA: Diagnosis not present

## 2018-08-02 DIAGNOSIS — G8929 Other chronic pain: Secondary | ICD-10-CM | POA: Diagnosis not present

## 2018-08-07 DIAGNOSIS — Z7409 Other reduced mobility: Secondary | ICD-10-CM | POA: Diagnosis not present

## 2018-08-07 DIAGNOSIS — M6281 Muscle weakness (generalized): Secondary | ICD-10-CM | POA: Diagnosis not present

## 2018-08-07 DIAGNOSIS — M4726 Other spondylosis with radiculopathy, lumbar region: Secondary | ICD-10-CM | POA: Diagnosis not present

## 2018-08-07 DIAGNOSIS — M5442 Lumbago with sciatica, left side: Secondary | ICD-10-CM | POA: Diagnosis not present

## 2018-08-07 DIAGNOSIS — R29898 Other symptoms and signs involving the musculoskeletal system: Secondary | ICD-10-CM | POA: Diagnosis not present

## 2018-08-07 DIAGNOSIS — G8929 Other chronic pain: Secondary | ICD-10-CM | POA: Diagnosis not present

## 2018-08-09 DIAGNOSIS — M4726 Other spondylosis with radiculopathy, lumbar region: Secondary | ICD-10-CM | POA: Diagnosis not present

## 2018-08-09 DIAGNOSIS — M5442 Lumbago with sciatica, left side: Secondary | ICD-10-CM | POA: Diagnosis not present

## 2018-08-09 DIAGNOSIS — M6281 Muscle weakness (generalized): Secondary | ICD-10-CM | POA: Diagnosis not present

## 2018-08-09 DIAGNOSIS — G8929 Other chronic pain: Secondary | ICD-10-CM | POA: Diagnosis not present

## 2018-08-09 DIAGNOSIS — R29898 Other symptoms and signs involving the musculoskeletal system: Secondary | ICD-10-CM | POA: Diagnosis not present

## 2018-08-09 DIAGNOSIS — Z7409 Other reduced mobility: Secondary | ICD-10-CM | POA: Diagnosis not present

## 2018-08-13 IMAGING — CT CT ORBITS W/ CM
3 series · 12 of 47 positions shown, 14 images · IV contrast (iopamidol)
Comparison: None.

CLINICAL DATA: RIGHT supraorbital pain. Symptoms for 3 days. No
injury.

EXAM:
CT ORBITS WITH CONTRAST
TECHNIQUE: Multidetector CT images was performed according to the standard
protocol following intravenous contrast administration.
CONTRAST:  75mL KEWL9S-SYY IOPAMIDOL (KEWL9S-SYY) INJECTION 61%

[Series 3: facialbone 2.0 st · axial · 0.32mm/px · z∈[-146,-70]mm · 6 of 50 slices shown, 8 images]
[im 6/50  brain]
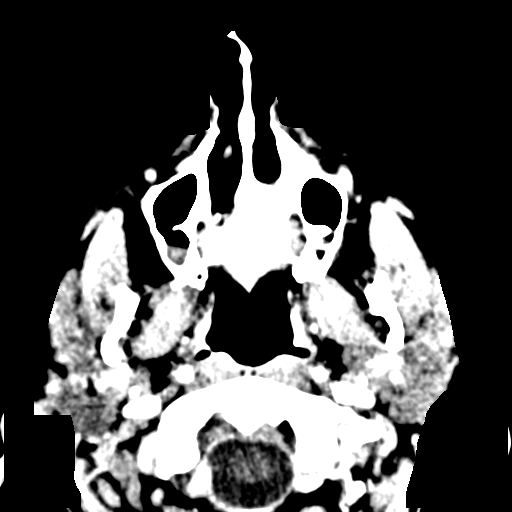
[im 6/50  bone]
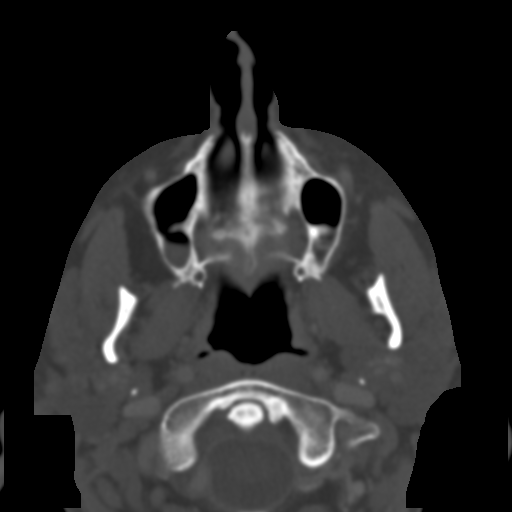
[im 14/50  bone]
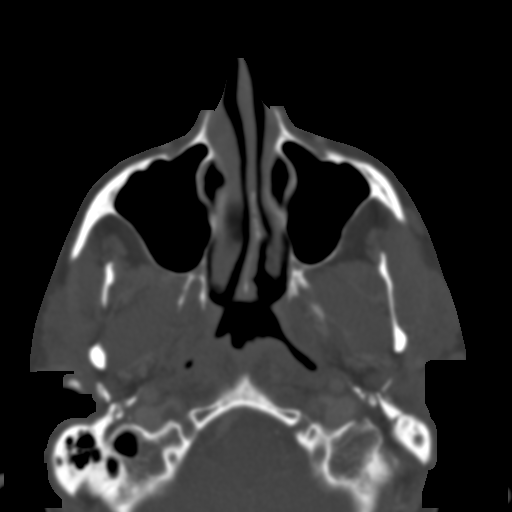
[im 21/50  bone]
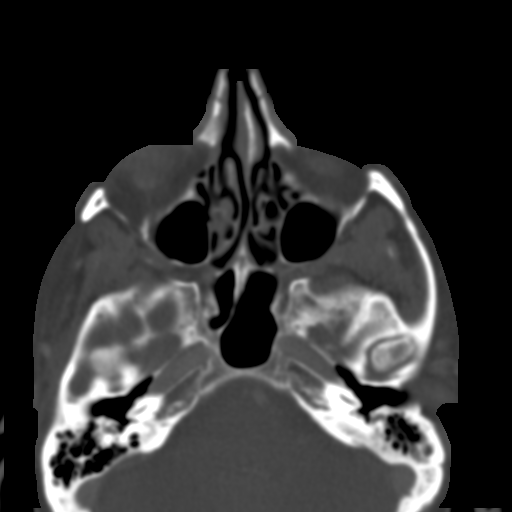
[im 29/50  bone]
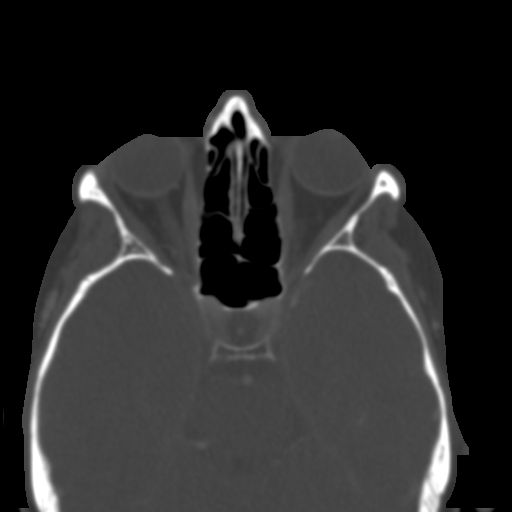
[im 38/50  brain]
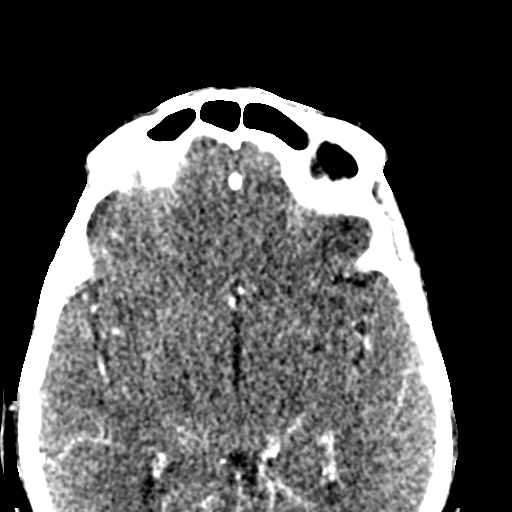
[im 38/50  bone]
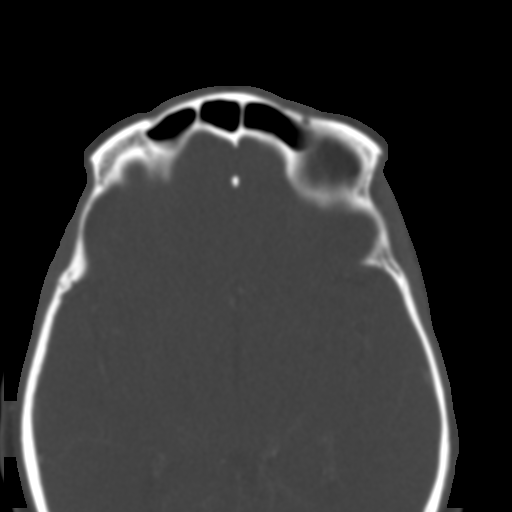
[im 44/50  bone]
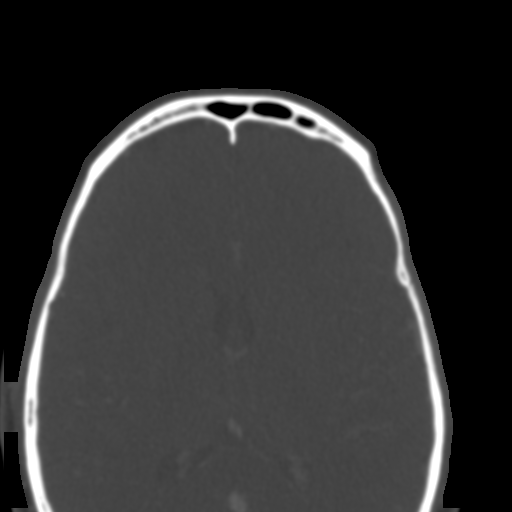

[Series 7: facialbone 2.0 cor st · coronal · 0.19mm/px · 3 of 76 slices shown]
[im 26/76  bone]
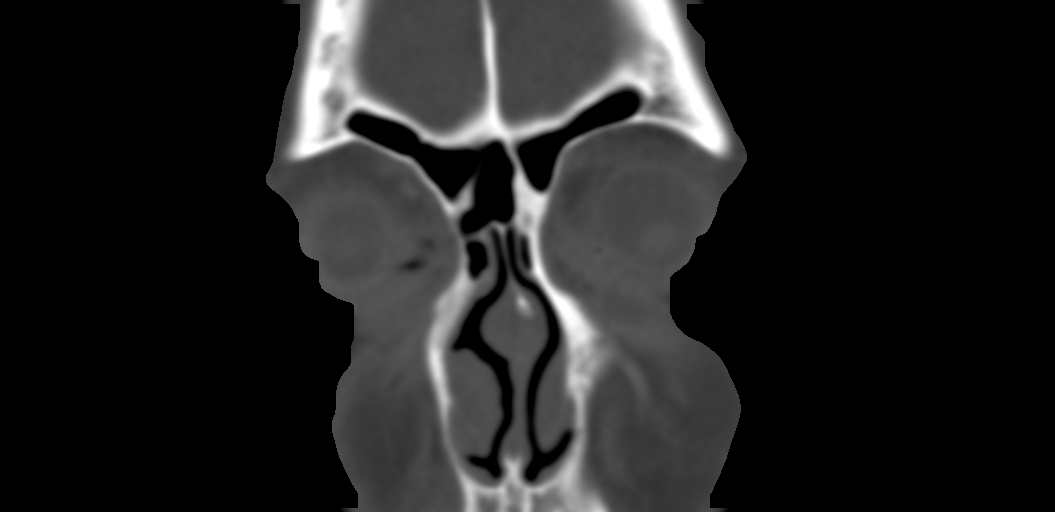
[im 34/76  bone]
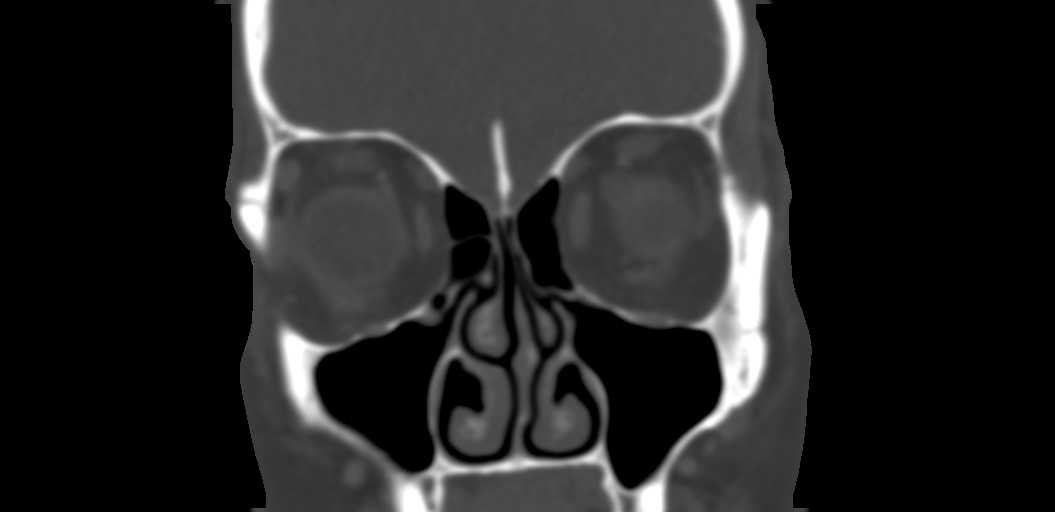
[im 42/76  bone]
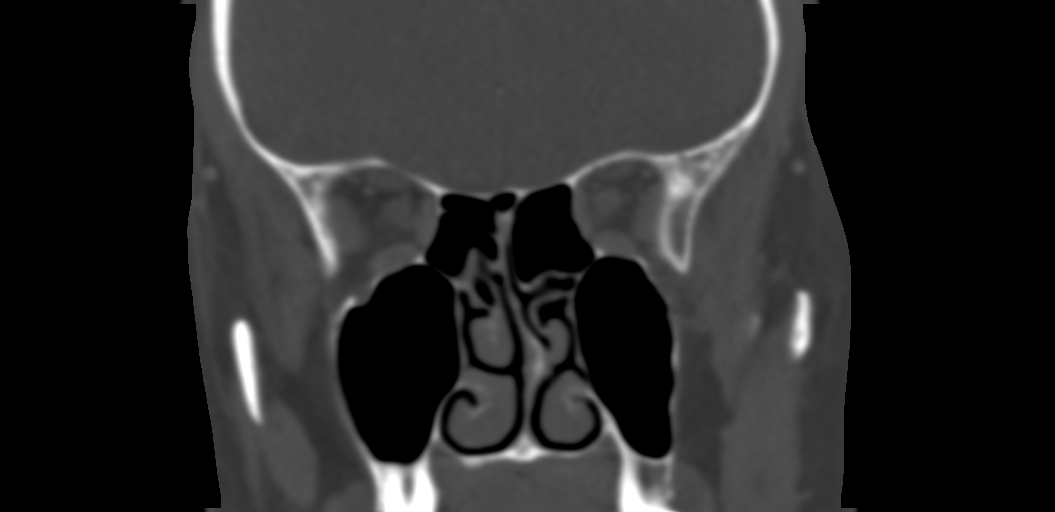

[Series 8: facialbone 2.0 sag st · sagittal · 0.19mm/px · 3 of 78 slices shown]
[im 26/78  bone]
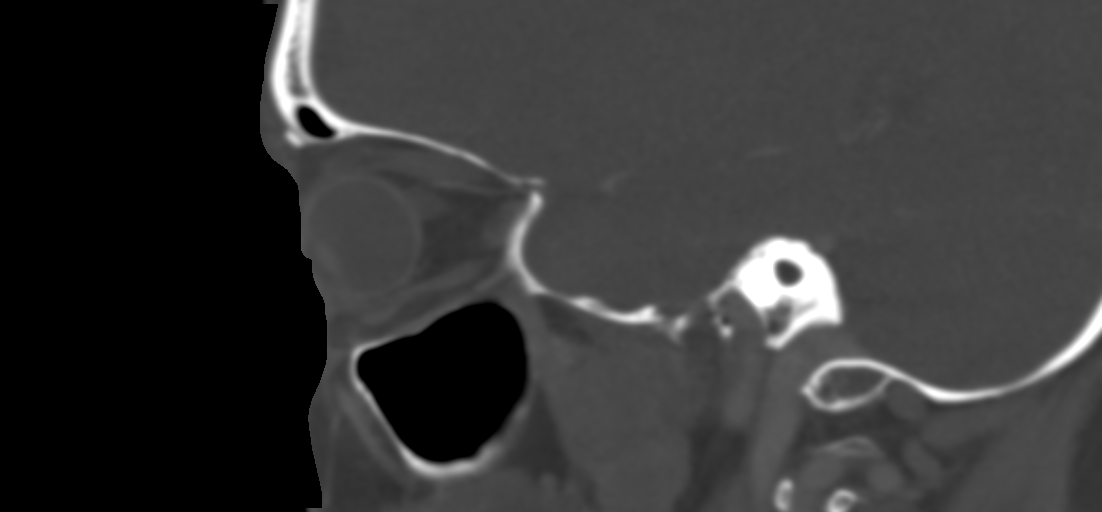
[im 39/78  bone]
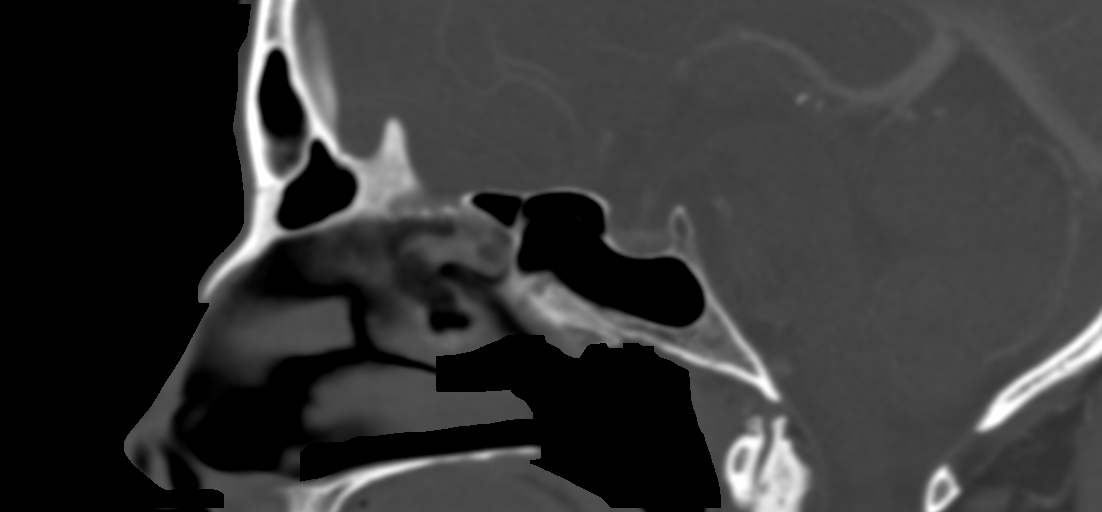
[im 52/78  bone]
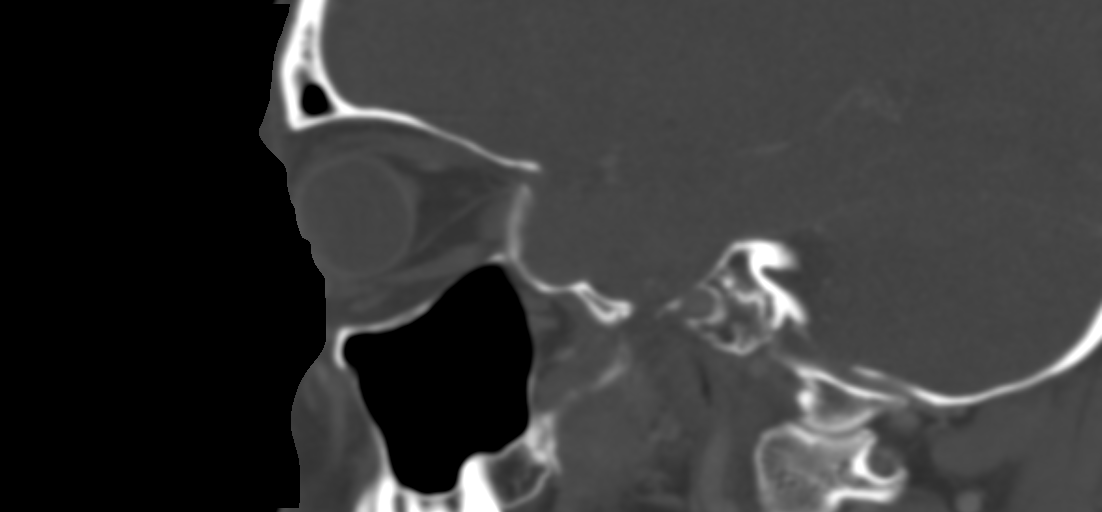

[12 of 47 positions shown; findings below may reference images not displayed]

FINDINGS: Orbits: No traumatic or inflammatory finding. Globes, optic nerves,
orbital fat, extraocular muscles, vascular structures, and lacrimal
glands are normal.

Visualized sinuses: No significant opacity or layering fluid.

Soft tissues: Negative.

Limited intracranial: No significant or unexpected finding.
IMPRESSION: Negative exam.

## 2018-08-14 DIAGNOSIS — G8929 Other chronic pain: Secondary | ICD-10-CM | POA: Diagnosis not present

## 2018-08-14 DIAGNOSIS — M6281 Muscle weakness (generalized): Secondary | ICD-10-CM | POA: Diagnosis not present

## 2018-08-14 DIAGNOSIS — R29898 Other symptoms and signs involving the musculoskeletal system: Secondary | ICD-10-CM | POA: Diagnosis not present

## 2018-08-14 DIAGNOSIS — M4726 Other spondylosis with radiculopathy, lumbar region: Secondary | ICD-10-CM | POA: Diagnosis not present

## 2018-08-14 DIAGNOSIS — M5442 Lumbago with sciatica, left side: Secondary | ICD-10-CM | POA: Diagnosis not present

## 2018-08-14 DIAGNOSIS — Z7409 Other reduced mobility: Secondary | ICD-10-CM | POA: Diagnosis not present

## 2018-08-16 DIAGNOSIS — M4726 Other spondylosis with radiculopathy, lumbar region: Secondary | ICD-10-CM | POA: Diagnosis not present

## 2018-08-16 DIAGNOSIS — G8929 Other chronic pain: Secondary | ICD-10-CM | POA: Diagnosis not present

## 2018-08-16 DIAGNOSIS — Z7409 Other reduced mobility: Secondary | ICD-10-CM | POA: Diagnosis not present

## 2018-08-16 DIAGNOSIS — M5442 Lumbago with sciatica, left side: Secondary | ICD-10-CM | POA: Diagnosis not present

## 2018-08-16 DIAGNOSIS — M6281 Muscle weakness (generalized): Secondary | ICD-10-CM | POA: Diagnosis not present

## 2018-08-16 DIAGNOSIS — R29898 Other symptoms and signs involving the musculoskeletal system: Secondary | ICD-10-CM | POA: Diagnosis not present

## 2018-08-23 DIAGNOSIS — M5442 Lumbago with sciatica, left side: Secondary | ICD-10-CM | POA: Diagnosis not present

## 2018-08-23 DIAGNOSIS — G8929 Other chronic pain: Secondary | ICD-10-CM | POA: Diagnosis not present

## 2018-08-23 DIAGNOSIS — R29898 Other symptoms and signs involving the musculoskeletal system: Secondary | ICD-10-CM | POA: Diagnosis not present

## 2018-08-23 DIAGNOSIS — Z7409 Other reduced mobility: Secondary | ICD-10-CM | POA: Diagnosis not present

## 2018-08-23 DIAGNOSIS — M6281 Muscle weakness (generalized): Secondary | ICD-10-CM | POA: Diagnosis not present

## 2018-08-23 DIAGNOSIS — M4726 Other spondylosis with radiculopathy, lumbar region: Secondary | ICD-10-CM | POA: Diagnosis not present

## 2018-08-28 DIAGNOSIS — Z7409 Other reduced mobility: Secondary | ICD-10-CM | POA: Diagnosis not present

## 2018-08-28 DIAGNOSIS — M5442 Lumbago with sciatica, left side: Secondary | ICD-10-CM | POA: Diagnosis not present

## 2018-08-28 DIAGNOSIS — R29898 Other symptoms and signs involving the musculoskeletal system: Secondary | ICD-10-CM | POA: Diagnosis not present

## 2018-08-28 DIAGNOSIS — M6281 Muscle weakness (generalized): Secondary | ICD-10-CM | POA: Diagnosis not present

## 2018-08-28 DIAGNOSIS — G8929 Other chronic pain: Secondary | ICD-10-CM | POA: Diagnosis not present

## 2018-09-06 DIAGNOSIS — G8929 Other chronic pain: Secondary | ICD-10-CM | POA: Diagnosis not present

## 2018-09-06 DIAGNOSIS — M6281 Muscle weakness (generalized): Secondary | ICD-10-CM | POA: Diagnosis not present

## 2018-09-06 DIAGNOSIS — Z7409 Other reduced mobility: Secondary | ICD-10-CM | POA: Diagnosis not present

## 2018-09-06 DIAGNOSIS — M5442 Lumbago with sciatica, left side: Secondary | ICD-10-CM | POA: Diagnosis not present

## 2018-09-24 DIAGNOSIS — H5213 Myopia, bilateral: Secondary | ICD-10-CM | POA: Diagnosis not present

## 2018-09-24 DIAGNOSIS — H25012 Cortical age-related cataract, left eye: Secondary | ICD-10-CM | POA: Diagnosis not present

## 2018-09-24 DIAGNOSIS — H5371 Glare sensitivity: Secondary | ICD-10-CM | POA: Diagnosis not present

## 2018-09-24 DIAGNOSIS — H25042 Posterior subcapsular polar age-related cataract, left eye: Secondary | ICD-10-CM | POA: Diagnosis not present

## 2018-09-24 DIAGNOSIS — H43393 Other vitreous opacities, bilateral: Secondary | ICD-10-CM | POA: Diagnosis not present

## 2018-09-24 DIAGNOSIS — H538 Other visual disturbances: Secondary | ICD-10-CM | POA: Diagnosis not present

## 2018-09-24 DIAGNOSIS — H2513 Age-related nuclear cataract, bilateral: Secondary | ICD-10-CM | POA: Diagnosis not present

## 2018-09-24 DIAGNOSIS — H524 Presbyopia: Secondary | ICD-10-CM | POA: Diagnosis not present

## 2018-09-24 DIAGNOSIS — Z8639 Personal history of other endocrine, nutritional and metabolic disease: Secondary | ICD-10-CM | POA: Diagnosis not present

## 2018-10-09 DIAGNOSIS — H25042 Posterior subcapsular polar age-related cataract, left eye: Secondary | ICD-10-CM | POA: Diagnosis not present

## 2018-10-09 DIAGNOSIS — H2512 Age-related nuclear cataract, left eye: Secondary | ICD-10-CM | POA: Diagnosis not present

## 2018-10-17 DIAGNOSIS — H25012 Cortical age-related cataract, left eye: Secondary | ICD-10-CM | POA: Diagnosis not present

## 2018-10-17 DIAGNOSIS — H2512 Age-related nuclear cataract, left eye: Secondary | ICD-10-CM | POA: Diagnosis not present

## 2018-10-17 DIAGNOSIS — H25812 Combined forms of age-related cataract, left eye: Secondary | ICD-10-CM | POA: Diagnosis not present

## 2018-10-17 DIAGNOSIS — H25042 Posterior subcapsular polar age-related cataract, left eye: Secondary | ICD-10-CM | POA: Diagnosis not present

## 2018-12-04 DIAGNOSIS — Z79899 Other long term (current) drug therapy: Secondary | ICD-10-CM | POA: Diagnosis not present

## 2018-12-04 DIAGNOSIS — L700 Acne vulgaris: Secondary | ICD-10-CM | POA: Diagnosis not present

## 2018-12-05 DIAGNOSIS — Z79899 Other long term (current) drug therapy: Secondary | ICD-10-CM | POA: Diagnosis not present

## 2018-12-25 DIAGNOSIS — H6501 Acute serous otitis media, right ear: Secondary | ICD-10-CM | POA: Diagnosis not present

## 2018-12-25 DIAGNOSIS — R070 Pain in throat: Secondary | ICD-10-CM | POA: Diagnosis not present

## 2018-12-25 DIAGNOSIS — R51 Headache: Secondary | ICD-10-CM | POA: Diagnosis not present

## 2018-12-25 DIAGNOSIS — R05 Cough: Secondary | ICD-10-CM | POA: Diagnosis not present

## 2019-01-01 DIAGNOSIS — L700 Acne vulgaris: Secondary | ICD-10-CM | POA: Diagnosis not present

## 2019-01-01 DIAGNOSIS — Z79899 Other long term (current) drug therapy: Secondary | ICD-10-CM | POA: Diagnosis not present

## 2019-02-20 DIAGNOSIS — Z720 Tobacco use: Secondary | ICD-10-CM | POA: Diagnosis not present

## 2019-02-20 DIAGNOSIS — R51 Headache: Secondary | ICD-10-CM | POA: Diagnosis not present

## 2019-02-20 DIAGNOSIS — M791 Myalgia, unspecified site: Secondary | ICD-10-CM | POA: Diagnosis not present

## 2019-02-20 DIAGNOSIS — R0602 Shortness of breath: Secondary | ICD-10-CM | POA: Diagnosis not present

## 2019-02-20 DIAGNOSIS — R6883 Chills (without fever): Secondary | ICD-10-CM | POA: Diagnosis not present

## 2019-02-20 DIAGNOSIS — R05 Cough: Secondary | ICD-10-CM | POA: Diagnosis not present

## 2019-03-05 DIAGNOSIS — Z79899 Other long term (current) drug therapy: Secondary | ICD-10-CM | POA: Diagnosis not present

## 2019-03-05 DIAGNOSIS — L700 Acne vulgaris: Secondary | ICD-10-CM | POA: Diagnosis not present

## 2019-04-20 IMAGING — DX DG CHEST 2V
2 series · 2 of 2 positions shown · non-contrast
Comparison: 06/19/2017.

CLINICAL DATA: Eight day history of cough associated with chest
tightness.

EXAM:
CHEST  2 VIEW

[chest pa]
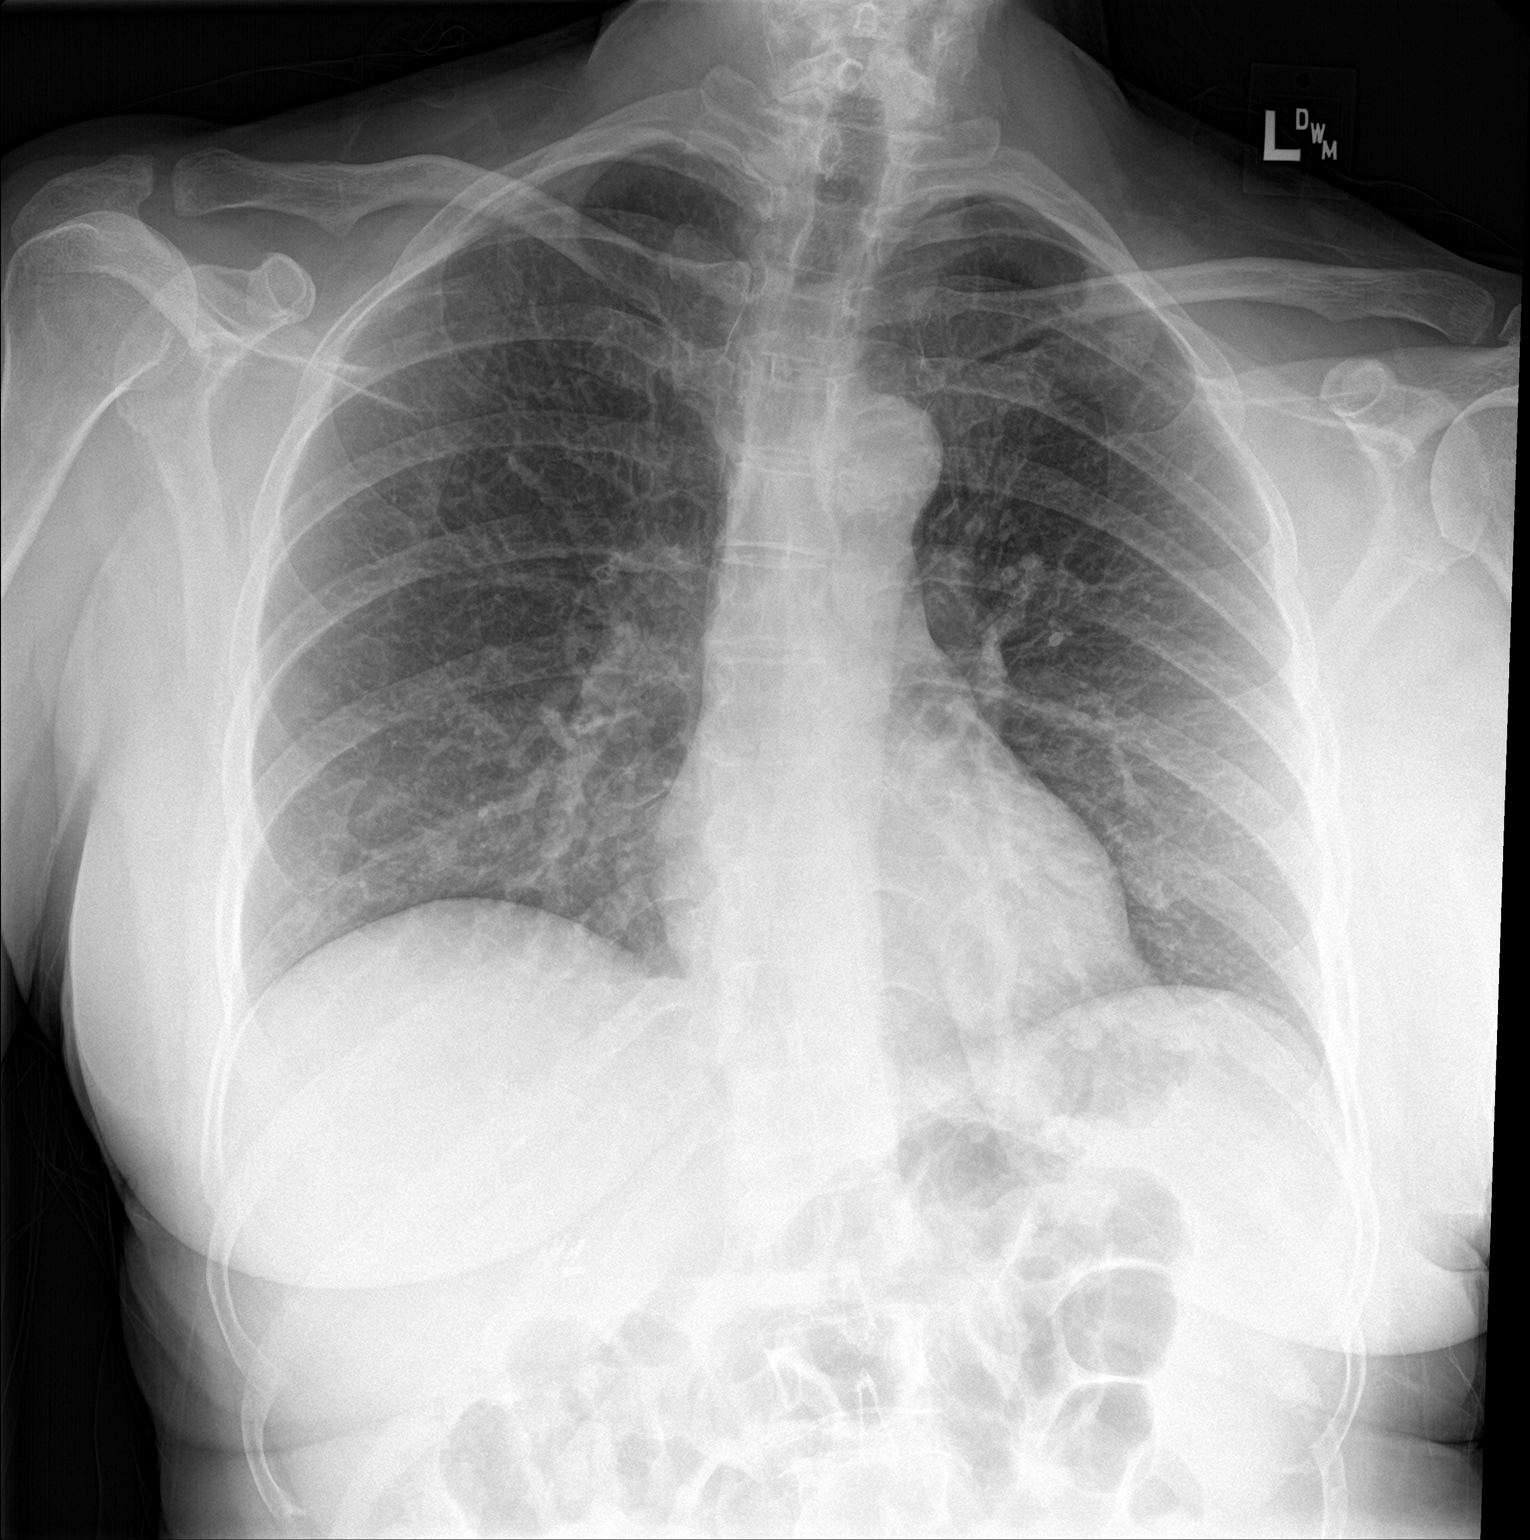

[chest lat]
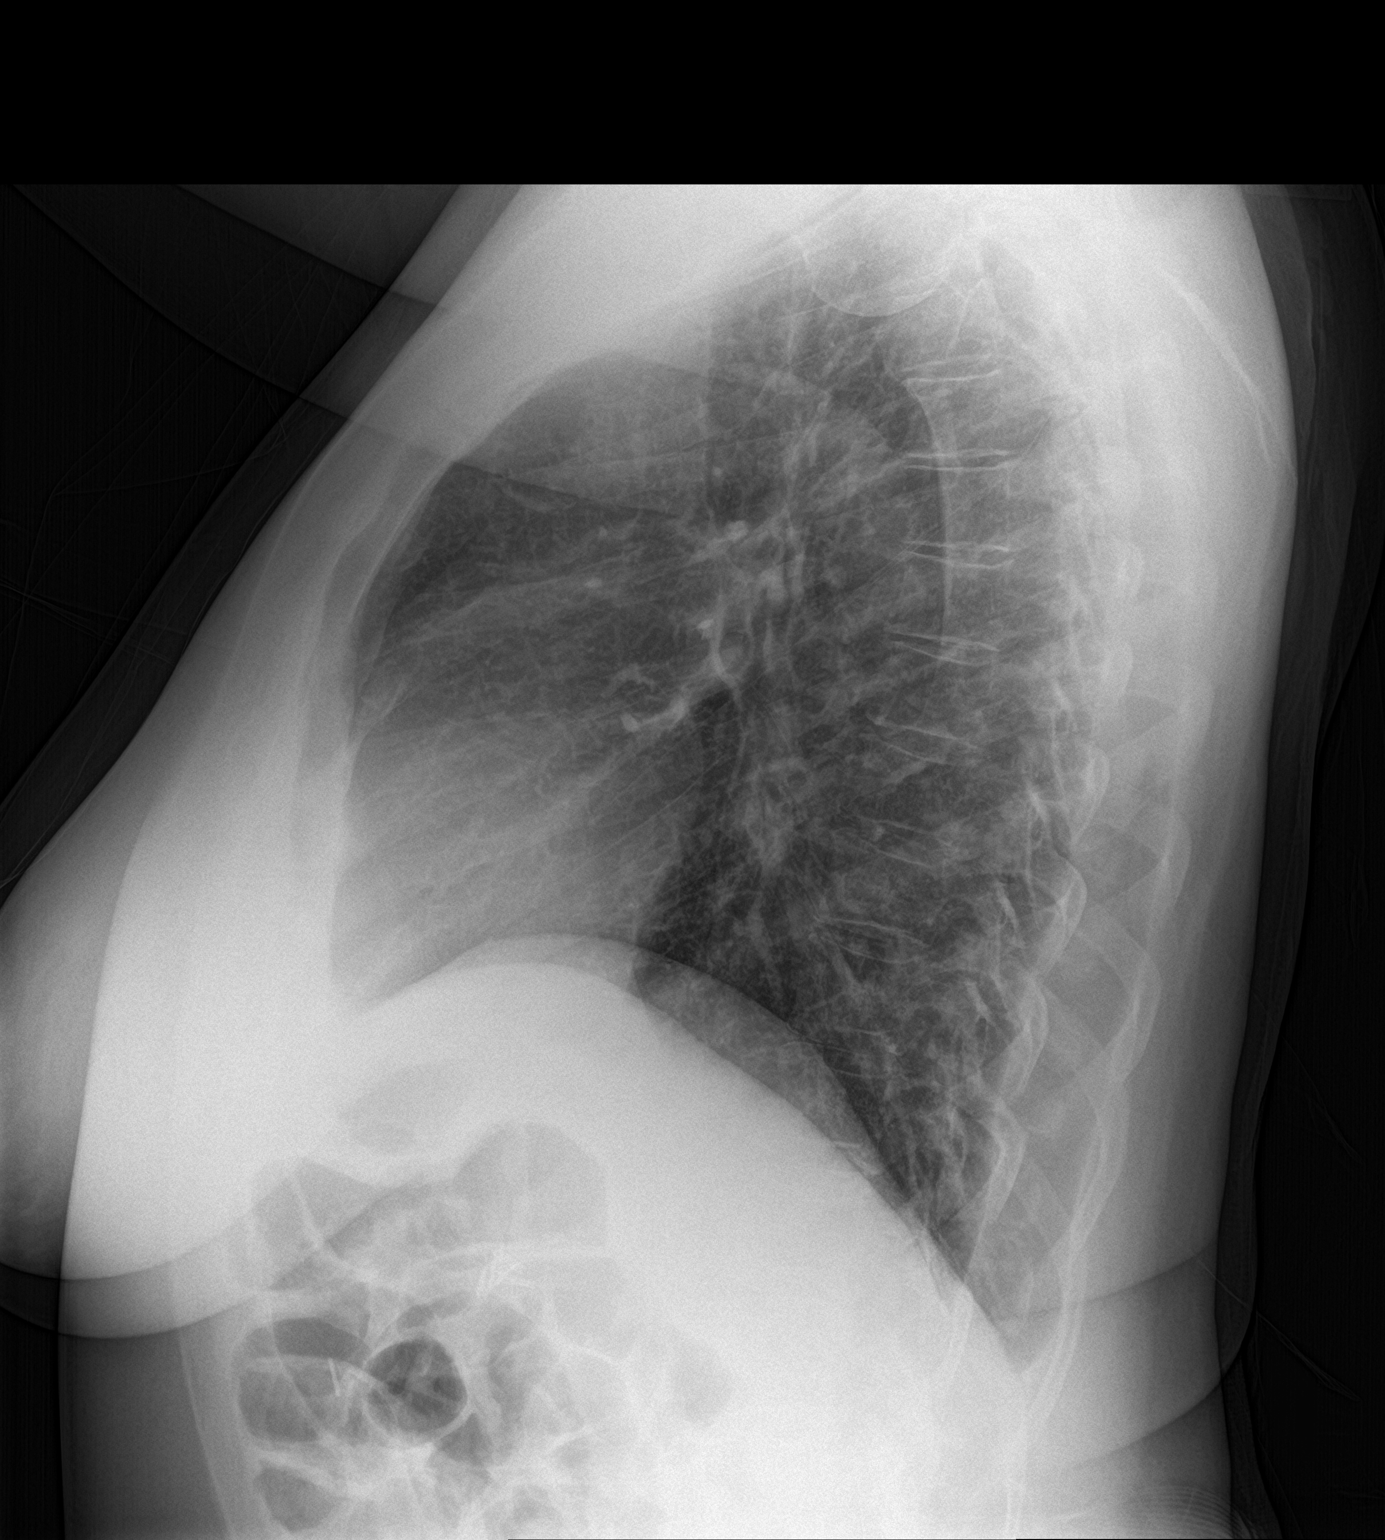

[2 of 2 positions shown; findings below may reference images not displayed]

FINDINGS: Cardiomediastinal silhouette unremarkable, unchanged. Mildly
prominent bronchovascular markings diffusely and mild central
peribronchial thickening, more so than on the prior examination.
Lungs otherwise clear. No localized airspace consolidation. No
pleural effusions. No pneumothorax. Normal pulmonary vascularity.
Thoracolumbar dextroscoliosis is noted previously.
IMPRESSION: Mild changes of acute bronchitis and/or asthma without focal
airspace pneumonia.

## 2019-05-21 DIAGNOSIS — Z79899 Other long term (current) drug therapy: Secondary | ICD-10-CM | POA: Diagnosis not present

## 2019-05-21 DIAGNOSIS — Z0000 Encounter for general adult medical examination without abnormal findings: Secondary | ICD-10-CM | POA: Diagnosis not present

## 2019-05-29 DIAGNOSIS — F17210 Nicotine dependence, cigarettes, uncomplicated: Secondary | ICD-10-CM | POA: Diagnosis not present

## 2019-05-29 DIAGNOSIS — J449 Chronic obstructive pulmonary disease, unspecified: Secondary | ICD-10-CM | POA: Diagnosis not present

## 2019-05-29 DIAGNOSIS — E039 Hypothyroidism, unspecified: Secondary | ICD-10-CM | POA: Diagnosis not present

## 2019-05-29 DIAGNOSIS — Z7189 Other specified counseling: Secondary | ICD-10-CM | POA: Diagnosis not present

## 2019-05-29 DIAGNOSIS — Z0000 Encounter for general adult medical examination without abnormal findings: Secondary | ICD-10-CM | POA: Diagnosis not present

## 2019-06-04 DIAGNOSIS — Z79899 Other long term (current) drug therapy: Secondary | ICD-10-CM | POA: Diagnosis not present

## 2019-06-04 DIAGNOSIS — L700 Acne vulgaris: Secondary | ICD-10-CM | POA: Diagnosis not present

## 2019-07-01 DIAGNOSIS — Z1231 Encounter for screening mammogram for malignant neoplasm of breast: Secondary | ICD-10-CM | POA: Diagnosis not present

## 2019-07-24 DIAGNOSIS — R928 Other abnormal and inconclusive findings on diagnostic imaging of breast: Secondary | ICD-10-CM | POA: Diagnosis not present

## 2019-07-24 DIAGNOSIS — R922 Inconclusive mammogram: Secondary | ICD-10-CM | POA: Diagnosis not present

## 2019-08-30 DIAGNOSIS — J449 Chronic obstructive pulmonary disease, unspecified: Secondary | ICD-10-CM | POA: Diagnosis not present

## 2019-08-30 DIAGNOSIS — J342 Deviated nasal septum: Secondary | ICD-10-CM | POA: Diagnosis not present

## 2019-08-30 DIAGNOSIS — Z2821 Immunization not carried out because of patient refusal: Secondary | ICD-10-CM | POA: Diagnosis not present

## 2019-09-05 DIAGNOSIS — L0390 Cellulitis, unspecified: Secondary | ICD-10-CM | POA: Diagnosis not present

## 2019-09-05 DIAGNOSIS — Z79899 Other long term (current) drug therapy: Secondary | ICD-10-CM | POA: Diagnosis not present

## 2019-09-05 DIAGNOSIS — L700 Acne vulgaris: Secondary | ICD-10-CM | POA: Diagnosis not present

## 2019-09-16 DIAGNOSIS — J343 Hypertrophy of nasal turbinates: Secondary | ICD-10-CM | POA: Diagnosis not present

## 2019-09-16 DIAGNOSIS — J342 Deviated nasal septum: Secondary | ICD-10-CM | POA: Diagnosis not present

## 2019-09-25 DIAGNOSIS — Z01812 Encounter for preprocedural laboratory examination: Secondary | ICD-10-CM | POA: Diagnosis not present

## 2019-09-25 DIAGNOSIS — J342 Deviated nasal septum: Secondary | ICD-10-CM | POA: Diagnosis not present

## 2019-09-25 DIAGNOSIS — J343 Hypertrophy of nasal turbinates: Secondary | ICD-10-CM | POA: Diagnosis not present

## 2019-09-25 DIAGNOSIS — Z20828 Contact with and (suspected) exposure to other viral communicable diseases: Secondary | ICD-10-CM | POA: Diagnosis not present

## 2019-10-01 DIAGNOSIS — J343 Hypertrophy of nasal turbinates: Secondary | ICD-10-CM | POA: Diagnosis not present

## 2019-10-01 DIAGNOSIS — F17210 Nicotine dependence, cigarettes, uncomplicated: Secondary | ICD-10-CM | POA: Diagnosis not present

## 2019-10-01 DIAGNOSIS — J342 Deviated nasal septum: Secondary | ICD-10-CM | POA: Diagnosis not present

## 2019-10-16 DIAGNOSIS — J343 Hypertrophy of nasal turbinates: Secondary | ICD-10-CM | POA: Diagnosis not present

## 2019-11-08 DIAGNOSIS — J029 Acute pharyngitis, unspecified: Secondary | ICD-10-CM | POA: Diagnosis not present

## 2019-11-08 DIAGNOSIS — R6883 Chills (without fever): Secondary | ICD-10-CM | POA: Diagnosis not present

## 2019-11-08 DIAGNOSIS — R05 Cough: Secondary | ICD-10-CM | POA: Diagnosis not present

## 2019-11-08 DIAGNOSIS — R519 Headache, unspecified: Secondary | ICD-10-CM | POA: Diagnosis not present

## 2019-11-08 DIAGNOSIS — R0981 Nasal congestion: Secondary | ICD-10-CM | POA: Diagnosis not present

## 2019-11-08 DIAGNOSIS — J441 Chronic obstructive pulmonary disease with (acute) exacerbation: Secondary | ICD-10-CM | POA: Diagnosis not present

## 2019-11-08 DIAGNOSIS — R5383 Other fatigue: Secondary | ICD-10-CM | POA: Diagnosis not present

## 2019-11-08 DIAGNOSIS — Z20828 Contact with and (suspected) exposure to other viral communicable diseases: Secondary | ICD-10-CM | POA: Diagnosis not present

## 2019-11-08 DIAGNOSIS — R062 Wheezing: Secondary | ICD-10-CM | POA: Diagnosis not present

## 2019-12-12 DIAGNOSIS — L700 Acne vulgaris: Secondary | ICD-10-CM | POA: Diagnosis not present

## 2019-12-12 DIAGNOSIS — Z79899 Other long term (current) drug therapy: Secondary | ICD-10-CM | POA: Diagnosis not present

## 2020-01-06 DIAGNOSIS — Z961 Presence of intraocular lens: Secondary | ICD-10-CM | POA: Diagnosis not present

## 2020-01-06 DIAGNOSIS — H2511 Age-related nuclear cataract, right eye: Secondary | ICD-10-CM | POA: Diagnosis not present

## 2020-01-06 DIAGNOSIS — H5211 Myopia, right eye: Secondary | ICD-10-CM | POA: Diagnosis not present

## 2020-01-06 DIAGNOSIS — H5202 Hypermetropia, left eye: Secondary | ICD-10-CM | POA: Diagnosis not present

## 2020-01-06 DIAGNOSIS — H16223 Keratoconjunctivitis sicca, not specified as Sjogren's, bilateral: Secondary | ICD-10-CM | POA: Diagnosis not present

## 2020-01-06 DIAGNOSIS — H43393 Other vitreous opacities, bilateral: Secondary | ICD-10-CM | POA: Diagnosis not present

## 2020-01-06 DIAGNOSIS — H25041 Posterior subcapsular polar age-related cataract, right eye: Secondary | ICD-10-CM | POA: Diagnosis not present

## 2020-01-06 DIAGNOSIS — H524 Presbyopia: Secondary | ICD-10-CM | POA: Diagnosis not present

## 2020-02-03 DIAGNOSIS — Z20828 Contact with and (suspected) exposure to other viral communicable diseases: Secondary | ICD-10-CM | POA: Diagnosis not present

## 2020-02-04 DIAGNOSIS — Z20822 Contact with and (suspected) exposure to covid-19: Secondary | ICD-10-CM | POA: Diagnosis not present

## 2020-02-04 DIAGNOSIS — R6889 Other general symptoms and signs: Secondary | ICD-10-CM | POA: Diagnosis not present

## 2020-02-04 DIAGNOSIS — J441 Chronic obstructive pulmonary disease with (acute) exacerbation: Secondary | ICD-10-CM | POA: Diagnosis not present

## 2020-02-04 DIAGNOSIS — R079 Chest pain, unspecified: Secondary | ICD-10-CM | POA: Diagnosis not present

## 2020-02-04 DIAGNOSIS — M546 Pain in thoracic spine: Secondary | ICD-10-CM | POA: Diagnosis not present

## 2020-02-04 DIAGNOSIS — R05 Cough: Secondary | ICD-10-CM | POA: Diagnosis not present

## 2020-03-17 DIAGNOSIS — L700 Acne vulgaris: Secondary | ICD-10-CM | POA: Diagnosis not present

## 2020-06-16 DIAGNOSIS — L700 Acne vulgaris: Secondary | ICD-10-CM | POA: Diagnosis not present

## 2020-06-16 DIAGNOSIS — L680 Hirsutism: Secondary | ICD-10-CM | POA: Diagnosis not present

## 2020-06-17 DIAGNOSIS — Z79899 Other long term (current) drug therapy: Secondary | ICD-10-CM | POA: Diagnosis not present

## 2020-06-17 DIAGNOSIS — L700 Acne vulgaris: Secondary | ICD-10-CM | POA: Diagnosis not present

## 2020-09-22 DIAGNOSIS — L570 Actinic keratosis: Secondary | ICD-10-CM | POA: Diagnosis not present

## 2020-09-22 DIAGNOSIS — L700 Acne vulgaris: Secondary | ICD-10-CM | POA: Diagnosis not present

## 2020-09-28 DIAGNOSIS — Z20822 Contact with and (suspected) exposure to covid-19: Secondary | ICD-10-CM | POA: Diagnosis not present

## 2020-09-28 DIAGNOSIS — R509 Fever, unspecified: Secondary | ICD-10-CM | POA: Diagnosis not present

## 2020-09-28 DIAGNOSIS — R059 Cough, unspecified: Secondary | ICD-10-CM | POA: Diagnosis not present

## 2020-09-28 DIAGNOSIS — R519 Headache, unspecified: Secondary | ICD-10-CM | POA: Diagnosis not present

## 2020-09-28 DIAGNOSIS — R0981 Nasal congestion: Secondary | ICD-10-CM | POA: Diagnosis not present

## 2020-09-28 DIAGNOSIS — R0602 Shortness of breath: Secondary | ICD-10-CM | POA: Diagnosis not present

## 2020-09-28 DIAGNOSIS — J069 Acute upper respiratory infection, unspecified: Secondary | ICD-10-CM | POA: Diagnosis not present

## 2020-12-22 DIAGNOSIS — L700 Acne vulgaris: Secondary | ICD-10-CM | POA: Diagnosis not present

## 2020-12-28 DIAGNOSIS — U071 COVID-19: Secondary | ICD-10-CM | POA: Diagnosis not present

## 2020-12-28 DIAGNOSIS — H9202 Otalgia, left ear: Secondary | ICD-10-CM | POA: Diagnosis not present

## 2021-03-23 DIAGNOSIS — J40 Bronchitis, not specified as acute or chronic: Secondary | ICD-10-CM | POA: Diagnosis not present

## 2021-03-23 DIAGNOSIS — H6502 Acute serous otitis media, left ear: Secondary | ICD-10-CM | POA: Diagnosis not present

## 2021-03-23 DIAGNOSIS — J449 Chronic obstructive pulmonary disease, unspecified: Secondary | ICD-10-CM | POA: Diagnosis not present

## 2021-03-23 DIAGNOSIS — F17200 Nicotine dependence, unspecified, uncomplicated: Secondary | ICD-10-CM | POA: Diagnosis not present

## 2021-04-29 DIAGNOSIS — Z01419 Encounter for gynecological examination (general) (routine) without abnormal findings: Secondary | ICD-10-CM | POA: Diagnosis not present

## 2021-04-29 DIAGNOSIS — R232 Flushing: Secondary | ICD-10-CM | POA: Diagnosis not present

## 2021-04-29 DIAGNOSIS — N764 Abscess of vulva: Secondary | ICD-10-CM | POA: Diagnosis not present

## 2021-04-29 DIAGNOSIS — Z90710 Acquired absence of both cervix and uterus: Secondary | ICD-10-CM | POA: Diagnosis not present

## 2021-04-29 DIAGNOSIS — N951 Menopausal and female climacteric states: Secondary | ICD-10-CM | POA: Diagnosis not present

## 2021-04-29 DIAGNOSIS — R4586 Emotional lability: Secondary | ICD-10-CM | POA: Diagnosis not present

## 2021-04-29 DIAGNOSIS — Z79890 Hormone replacement therapy (postmenopausal): Secondary | ICD-10-CM | POA: Diagnosis not present

## 2021-04-29 DIAGNOSIS — F17200 Nicotine dependence, unspecified, uncomplicated: Secondary | ICD-10-CM | POA: Diagnosis not present

## 2021-05-25 DIAGNOSIS — L700 Acne vulgaris: Secondary | ICD-10-CM | POA: Diagnosis not present

## 2021-06-14 DIAGNOSIS — Z1231 Encounter for screening mammogram for malignant neoplasm of breast: Secondary | ICD-10-CM | POA: Diagnosis not present

## 2021-07-01 DIAGNOSIS — H6503 Acute serous otitis media, bilateral: Secondary | ICD-10-CM | POA: Diagnosis not present

## 2021-07-01 DIAGNOSIS — H60333 Swimmer's ear, bilateral: Secondary | ICD-10-CM | POA: Diagnosis not present

## 2021-08-13 DIAGNOSIS — H25041 Posterior subcapsular polar age-related cataract, right eye: Secondary | ICD-10-CM | POA: Diagnosis not present

## 2021-08-13 DIAGNOSIS — H16223 Keratoconjunctivitis sicca, not specified as Sjogren's, bilateral: Secondary | ICD-10-CM | POA: Diagnosis not present

## 2021-08-13 DIAGNOSIS — H5202 Hypermetropia, left eye: Secondary | ICD-10-CM | POA: Diagnosis not present

## 2021-08-13 DIAGNOSIS — H5211 Myopia, right eye: Secondary | ICD-10-CM | POA: Diagnosis not present

## 2021-08-13 DIAGNOSIS — H52201 Unspecified astigmatism, right eye: Secondary | ICD-10-CM | POA: Diagnosis not present

## 2021-08-13 DIAGNOSIS — H2511 Age-related nuclear cataract, right eye: Secondary | ICD-10-CM | POA: Diagnosis not present

## 2021-08-13 DIAGNOSIS — H524 Presbyopia: Secondary | ICD-10-CM | POA: Diagnosis not present

## 2021-08-13 DIAGNOSIS — H43393 Other vitreous opacities, bilateral: Secondary | ICD-10-CM | POA: Diagnosis not present

## 2021-08-13 DIAGNOSIS — Z961 Presence of intraocular lens: Secondary | ICD-10-CM | POA: Diagnosis not present

## 2021-08-31 DIAGNOSIS — L700 Acne vulgaris: Secondary | ICD-10-CM | POA: Diagnosis not present

## 2021-08-31 DIAGNOSIS — D485 Neoplasm of uncertain behavior of skin: Secondary | ICD-10-CM | POA: Diagnosis not present

## 2021-09-10 DIAGNOSIS — Z136 Encounter for screening for cardiovascular disorders: Secondary | ICD-10-CM | POA: Diagnosis not present

## 2021-09-10 DIAGNOSIS — E039 Hypothyroidism, unspecified: Secondary | ICD-10-CM | POA: Diagnosis not present

## 2021-09-10 DIAGNOSIS — Z0000 Encounter for general adult medical examination without abnormal findings: Secondary | ICD-10-CM | POA: Diagnosis not present

## 2021-09-15 DIAGNOSIS — Z0000 Encounter for general adult medical examination without abnormal findings: Secondary | ICD-10-CM | POA: Diagnosis not present

## 2021-09-15 DIAGNOSIS — F411 Generalized anxiety disorder: Secondary | ICD-10-CM | POA: Diagnosis not present

## 2021-09-15 DIAGNOSIS — J449 Chronic obstructive pulmonary disease, unspecified: Secondary | ICD-10-CM | POA: Diagnosis not present

## 2021-09-15 DIAGNOSIS — F17200 Nicotine dependence, unspecified, uncomplicated: Secondary | ICD-10-CM | POA: Diagnosis not present

## 2021-09-15 DIAGNOSIS — E039 Hypothyroidism, unspecified: Secondary | ICD-10-CM | POA: Diagnosis not present

## 2021-09-20 DIAGNOSIS — C4401 Basal cell carcinoma of skin of lip: Secondary | ICD-10-CM | POA: Diagnosis not present

## 2021-09-22 DIAGNOSIS — H2511 Age-related nuclear cataract, right eye: Secondary | ICD-10-CM | POA: Diagnosis not present

## 2021-09-22 DIAGNOSIS — H25811 Combined forms of age-related cataract, right eye: Secondary | ICD-10-CM | POA: Diagnosis not present

## 2021-09-29 DIAGNOSIS — B9689 Other specified bacterial agents as the cause of diseases classified elsewhere: Secondary | ICD-10-CM | POA: Diagnosis not present

## 2021-09-29 DIAGNOSIS — S80812A Abrasion, left lower leg, initial encounter: Secondary | ICD-10-CM | POA: Diagnosis not present

## 2021-09-29 DIAGNOSIS — Z23 Encounter for immunization: Secondary | ICD-10-CM | POA: Diagnosis not present

## 2021-11-17 DIAGNOSIS — L905 Scar conditions and fibrosis of skin: Secondary | ICD-10-CM | POA: Diagnosis not present
